# Patient Record
Sex: Male | Born: 1950 | Race: Black or African American | Hispanic: No | Marital: Single | Smoking: Never smoker
Health system: Southern US, Community
[De-identification: ages and names within clinical notes are randomized; demographics above are authoritative.]

## PROBLEM LIST (undated history)

## (undated) DIAGNOSIS — S4292XA Fracture of left shoulder girdle, part unspecified, initial encounter for closed fracture: Secondary | ICD-10-CM

## (undated) DIAGNOSIS — E785 Hyperlipidemia, unspecified: Secondary | ICD-10-CM

## (undated) DIAGNOSIS — K219 Gastro-esophageal reflux disease without esophagitis: Secondary | ICD-10-CM

## (undated) HISTORY — DX: Gastro-esophageal reflux disease without esophagitis: K21.9

## (undated) HISTORY — DX: Hyperlipidemia, unspecified: E78.5

## (undated) HISTORY — PX: OTHER SURGICAL HISTORY: SHX169

---

## 2008-03-07 ENCOUNTER — Ambulatory Visit (HOSPITAL_COMMUNITY): Admission: RE | Admit: 2008-03-07 | Discharge: 2008-03-07 | Payer: Self-pay | Admitting: General Surgery

## 2010-11-26 NOTE — H&P (Signed)
[name], Christopher Gates               ACCOUNT NO.:  [id number]   MEDICAL RECORD NO.:  [id number]          PATIENT TYPE:  AMB   LOCATION:  DAY                           FACILITY:  [facility]   PHYSICIAN:  Dalia Heading, M.D.  DATE OF BIRTH:  [birth year]   DATE OF ADMISSION:  DATE OF DISCHARGE:  LH                              HISTORY & PHYSICAL   CHIEF COMPLAINT:  Need for screening colonoscopy.   HISTORY OF PRESENT ILLNESS:  The patient is a 60 year old black male who  is referred for endoscopic evaluation.  He needs a colonoscopy for  screening purposes.  No abdominal pain, weight loss, nausea, vomiting,  diarrhea, constipation, melena, or hematochezia have been noted.  He has  never had a colonoscopy.  There is no family history of colon carcinoma.   PAST MEDICAL HISTORY:  High cholesterol levels.   PAST SURGICAL HISTORY:  Unknown.   CURRENT MEDICATIONS:  Simvastatin and omeprazole.   ALLERGIES:  No known drug allergies.   REVIEW OF SYSTEMS:  Noncontributory.   PHYSICAL EXAMINATION:  GENERAL:  The patient is a well-developed, well-  nourished black male in no acute distress.  LUNGS:  Clear to auscultation with equal breath sounds bilaterally.  HEART:  Regular rate and rhythm without S3, S4, or murmurs.  ABDOMEN:  Soft, nontender, and nondistended.  No hepatosplenomegaly or  masses are noted.  RECTAL:  Deferred due to the procedure.   IMPRESSION:  Need for screening colonoscopy.   PLAN:  The patient is scheduled for a colonoscopy on March 07, 2008.  The risks and benefits of the procedure including bleeding and  perforation were fully explained to the patient, who gave informed  consent.      Dalia Heading, M.D.  Electronically Signed     MAJ/MEDQ  D:  03/02/2008  T:  03/03/2008  Job:  [id number]   cc:   Kirk Ruths, M.D.  Fax: [phone]

## 2011-07-31 DIAGNOSIS — IMO0002 Reserved for concepts with insufficient information to code with codable children: Secondary | ICD-10-CM | POA: Diagnosis not present

## 2011-07-31 DIAGNOSIS — Z23 Encounter for immunization: Secondary | ICD-10-CM | POA: Diagnosis not present

## 2011-07-31 DIAGNOSIS — K219 Gastro-esophageal reflux disease without esophagitis: Secondary | ICD-10-CM | POA: Diagnosis not present

## 2011-07-31 DIAGNOSIS — E785 Hyperlipidemia, unspecified: Secondary | ICD-10-CM | POA: Diagnosis not present

## 2011-08-04 DIAGNOSIS — H409 Unspecified glaucoma: Secondary | ICD-10-CM | POA: Diagnosis not present

## 2011-08-04 DIAGNOSIS — H4011X0 Primary open-angle glaucoma, stage unspecified: Secondary | ICD-10-CM | POA: Diagnosis not present

## 2011-09-12 ENCOUNTER — Ambulatory Visit (HOSPITAL_COMMUNITY)
Admission: RE | Admit: 2011-09-12 | Discharge: 2011-09-12 | Disposition: A | Discharge status: Home / Self Care | Payer: Medicare Other | Source: Ambulatory Visit | Attending: Internal Medicine | Admitting: Internal Medicine

## 2011-09-12 ENCOUNTER — Other Ambulatory Visit (HOSPITAL_COMMUNITY): Payer: Self-pay | Admitting: Internal Medicine

## 2011-09-12 DIAGNOSIS — M79609 Pain in unspecified limb: Secondary | ICD-10-CM

## 2011-09-12 DIAGNOSIS — IMO0002 Reserved for concepts with insufficient information to code with codable children: Secondary | ICD-10-CM | POA: Diagnosis not present

## 2011-10-17 DIAGNOSIS — H5210 Myopia, unspecified eye: Secondary | ICD-10-CM | POA: Diagnosis not present

## 2011-10-17 DIAGNOSIS — H409 Unspecified glaucoma: Secondary | ICD-10-CM | POA: Diagnosis not present

## 2011-10-17 DIAGNOSIS — H4011X0 Primary open-angle glaucoma, stage unspecified: Secondary | ICD-10-CM | POA: Diagnosis not present

## 2011-10-17 DIAGNOSIS — H52229 Regular astigmatism, unspecified eye: Secondary | ICD-10-CM | POA: Diagnosis not present

## 2012-04-12 DIAGNOSIS — E782 Mixed hyperlipidemia: Secondary | ICD-10-CM | POA: Diagnosis not present

## 2012-04-12 DIAGNOSIS — Z125 Encounter for screening for malignant neoplasm of prostate: Secondary | ICD-10-CM | POA: Diagnosis not present

## 2012-04-22 DIAGNOSIS — Z23 Encounter for immunization: Secondary | ICD-10-CM | POA: Diagnosis not present

## 2012-07-29 DIAGNOSIS — E785 Hyperlipidemia, unspecified: Secondary | ICD-10-CM | POA: Diagnosis not present

## 2012-07-29 DIAGNOSIS — Z6826 Body mass index (BMI) 26.0-26.9, adult: Secondary | ICD-10-CM | POA: Diagnosis not present

## 2012-07-29 DIAGNOSIS — E781 Pure hyperglyceridemia: Secondary | ICD-10-CM | POA: Diagnosis not present

## 2012-07-29 DIAGNOSIS — K219 Gastro-esophageal reflux disease without esophagitis: Secondary | ICD-10-CM | POA: Diagnosis not present

## 2012-07-30 DIAGNOSIS — E781 Pure hyperglyceridemia: Secondary | ICD-10-CM | POA: Diagnosis not present

## 2012-07-30 DIAGNOSIS — E785 Hyperlipidemia, unspecified: Secondary | ICD-10-CM | POA: Diagnosis not present

## 2012-10-18 DIAGNOSIS — H4011X0 Primary open-angle glaucoma, stage unspecified: Secondary | ICD-10-CM | POA: Diagnosis not present

## 2013-04-07 DIAGNOSIS — Z23 Encounter for immunization: Secondary | ICD-10-CM | POA: Diagnosis not present

## 2013-05-02 DIAGNOSIS — H4011X0 Primary open-angle glaucoma, stage unspecified: Secondary | ICD-10-CM | POA: Diagnosis not present

## 2013-06-30 DIAGNOSIS — Z6826 Body mass index (BMI) 26.0-26.9, adult: Secondary | ICD-10-CM | POA: Diagnosis not present

## 2013-06-30 DIAGNOSIS — M542 Cervicalgia: Secondary | ICD-10-CM | POA: Diagnosis not present

## 2013-06-30 DIAGNOSIS — S139XXA Sprain of joints and ligaments of unspecified parts of neck, initial encounter: Secondary | ICD-10-CM | POA: Diagnosis not present

## 2013-07-22 DIAGNOSIS — E785 Hyperlipidemia, unspecified: Secondary | ICD-10-CM | POA: Diagnosis not present

## 2013-07-22 DIAGNOSIS — F70 Mild intellectual disabilities: Secondary | ICD-10-CM | POA: Diagnosis not present

## 2013-07-22 DIAGNOSIS — Z6826 Body mass index (BMI) 26.0-26.9, adult: Secondary | ICD-10-CM | POA: Diagnosis not present

## 2013-07-25 DIAGNOSIS — E785 Hyperlipidemia, unspecified: Secondary | ICD-10-CM | POA: Diagnosis not present

## 2013-07-25 DIAGNOSIS — Z6826 Body mass index (BMI) 26.0-26.9, adult: Secondary | ICD-10-CM | POA: Diagnosis not present

## 2013-10-17 ENCOUNTER — Other Ambulatory Visit (HOSPITAL_COMMUNITY): Payer: Self-pay | Admitting: Family Medicine

## 2013-10-17 ENCOUNTER — Ambulatory Visit (HOSPITAL_COMMUNITY)
Admission: RE | Admit: 2013-10-17 | Discharge: 2013-10-17 | Disposition: A | Discharge status: Home / Self Care | Payer: Medicare Other | Source: Ambulatory Visit | Attending: Family Medicine | Admitting: Family Medicine

## 2013-10-17 DIAGNOSIS — M25519 Pain in unspecified shoulder: Secondary | ICD-10-CM

## 2013-10-17 DIAGNOSIS — S42009A Fracture of unspecified part of unspecified clavicle, initial encounter for closed fracture: Secondary | ICD-10-CM | POA: Diagnosis not present

## 2013-10-17 DIAGNOSIS — S42033A Displaced fracture of lateral end of unspecified clavicle, initial encounter for closed fracture: Secondary | ICD-10-CM | POA: Insufficient documentation

## 2013-10-17 DIAGNOSIS — Z6826 Body mass index (BMI) 26.0-26.9, adult: Secondary | ICD-10-CM | POA: Diagnosis not present

## 2013-10-17 DIAGNOSIS — X58XXXA Exposure to other specified factors, initial encounter: Secondary | ICD-10-CM | POA: Insufficient documentation

## 2013-10-18 DIAGNOSIS — M25519 Pain in unspecified shoulder: Secondary | ICD-10-CM | POA: Diagnosis not present

## 2013-10-24 ENCOUNTER — Encounter (HOSPITAL_COMMUNITY): Payer: Self-pay | Admitting: Emergency Medicine

## 2013-10-24 ENCOUNTER — Ambulatory Visit (HOSPITAL_COMMUNITY)
Admission: RE | Admit: 2013-10-24 | Discharge: 2013-10-24 | Disposition: A | Discharge status: Home / Self Care | Payer: Medicare Other | Source: Ambulatory Visit | Attending: Family Medicine | Admitting: Family Medicine

## 2013-10-24 ENCOUNTER — Emergency Department (HOSPITAL_COMMUNITY)
Admission: EM | Admit: 2013-10-24 | Discharge: 2013-10-24 | Disposition: A | Discharge status: Home / Self Care | Payer: Medicare Other | Attending: Emergency Medicine | Admitting: Emergency Medicine

## 2013-10-24 ENCOUNTER — Other Ambulatory Visit (HOSPITAL_COMMUNITY): Payer: Self-pay | Admitting: Family Medicine

## 2013-10-24 DIAGNOSIS — M7989 Other specified soft tissue disorders: Secondary | ICD-10-CM

## 2013-10-24 DIAGNOSIS — I82409 Acute embolism and thrombosis of unspecified deep veins of unspecified lower extremity: Secondary | ICD-10-CM | POA: Insufficient documentation

## 2013-10-24 DIAGNOSIS — M79609 Pain in unspecified limb: Secondary | ICD-10-CM | POA: Diagnosis not present

## 2013-10-24 DIAGNOSIS — Z7901 Long term (current) use of anticoagulants: Secondary | ICD-10-CM | POA: Insufficient documentation

## 2013-10-24 DIAGNOSIS — Z79899 Other long term (current) drug therapy: Secondary | ICD-10-CM | POA: Insufficient documentation

## 2013-10-24 DIAGNOSIS — Z6826 Body mass index (BMI) 26.0-26.9, adult: Secondary | ICD-10-CM | POA: Diagnosis not present

## 2013-10-24 DIAGNOSIS — I824Y9 Acute embolism and thrombosis of unspecified deep veins of unspecified proximal lower extremity: Secondary | ICD-10-CM | POA: Diagnosis not present

## 2013-10-24 DIAGNOSIS — Z8781 Personal history of (healed) traumatic fracture: Secondary | ICD-10-CM | POA: Diagnosis not present

## 2013-10-24 HISTORY — DX: Fracture of left shoulder girdle, part unspecified, initial encounter for closed fracture: S42.92XA

## 2013-10-24 LAB — PROTIME-INR
INR: 1.06 (ref 0.00–1.49)
Prothrombin Time: 13.6 seconds (ref 11.6–15.2)

## 2013-10-24 LAB — CBC WITH DIFFERENTIAL/PLATELET
BASOS ABS: 0 10*3/uL (ref 0.0–0.1)
BASOS PCT: 0 % (ref 0–1)
EOS ABS: 0.2 10*3/uL (ref 0.0–0.7)
Eosinophils Relative: 3 % (ref 0–5)
HCT: 41.9 % (ref 39.0–52.0)
Hemoglobin: 13.9 g/dL (ref 13.0–17.0)
Lymphocytes Relative: 34 % (ref 12–46)
Lymphs Abs: 2.2 10*3/uL (ref 0.7–4.0)
MCH: 28.8 pg (ref 26.0–34.0)
MCHC: 33.2 g/dL (ref 30.0–36.0)
MCV: 86.7 fL (ref 78.0–100.0)
MONOS PCT: 8 % (ref 3–12)
Monocytes Absolute: 0.5 10*3/uL (ref 0.1–1.0)
NEUTROS PCT: 56 % (ref 43–77)
Neutro Abs: 3.6 10*3/uL (ref 1.7–7.7)
Platelets: 153 10*3/uL (ref 150–400)
RBC: 4.83 MIL/uL (ref 4.22–5.81)
RDW: 14.1 % (ref 11.5–15.5)
WBC: 6.5 10*3/uL (ref 4.0–10.5)

## 2013-10-24 LAB — BASIC METABOLIC PANEL
BUN: 15 mg/dL (ref 6–23)
CO2: 29 mEq/L (ref 19–32)
Calcium: 10 mg/dL (ref 8.4–10.5)
Chloride: 100 mEq/L (ref 96–112)
Creatinine, Ser: 1.21 mg/dL (ref 0.50–1.35)
GFR calc Af Amer: 73 mL/min — ABNORMAL LOW (ref >90)
GFR, EST NON AFRICAN AMERICAN: 63 mL/min — AB (ref >90)
Glucose, Bld: 92 mg/dL (ref 70–99)
POTASSIUM: 4.5 meq/L (ref 3.7–5.3)
Sodium: 139 mEq/L (ref 137–147)

## 2013-10-24 MED ORDER — RIVAROXABAN 15 MG PO TABS
15.0000 mg | ORAL_TABLET | Freq: Once | ORAL | Status: AC
  Administered 2013-10-24: 15 mg via ORAL
  Filled 2013-10-24: qty 1

## 2013-10-24 MED ORDER — XARELTO VTE STARTER PACK 15 & 20 MG PO TBPK: 15.0000 mg | ORAL_TABLET | ORAL | Status: DC

## 2013-10-24 NOTE — ED Notes (Signed)
Sent here from radiology ,  Has a DVT in left leg.  Doctor wanted him seen in er and admitted for blood thinners.

## 2013-10-24 NOTE — Discharge Instructions (Signed)
Information on my medicine - XARELTO (rivaroxaban)  This medication education was reviewed with me or my healthcare representative as part of my discharge preparation.  The pharmacist that spoke with me during my hospital stay was:  Ena Dawley, [place]? Xarelto was prescribed to treat blood clots that may have been found in the veins of your legs (deep vein thrombosis) or in your lungs (pulmonary embolism) and to reduce the risk of them occurring again.  What do you need to know about Xarelto? The starting dose is one 15 mg tablet taken TWICE daily with food for the FIRST 21 DAYS then on (enter date)  May 4th  the dose is changed to one 20 mg tablet taken ONCE A DAY with your evening meal.  DO NOT stop taking Xarelto without talking to the health care provider who prescribed the medication.  Refill your prescription for 20 mg tablets before you run out.  After discharge, you should have regular check-up appointments with your healthcare provider that is prescribing your Xarelto.  In the future your dose may need to be changed if your kidney function changes by a significant amount.  What do you do if you miss a dose? If you are taking Xarelto TWICE DAILY and you miss a dose, take it as soon as you remember. You may take two 15 mg tablets (total 30 mg) at the same time then resume your regularly scheduled 15 mg twice daily the next day.  If you are taking Xarelto ONCE DAILY and you miss a dose, take it as soon as you remember on the same day then continue your regularly scheduled once daily regimen the next day. Do not take two doses of Xarelto at the same time.   Important Safety Information Xarelto is a blood thinner medicine that can cause bleeding. You should call your healthcare provider right away if you experience any of the following:   Bleeding from an injury or your nose that does not stop.   Unusual colored urine (red or dark brown) or  unusual colored stools (red or black).   Unusual bruising for unknown reasons.   A serious fall or if you hit your head (even if there is no bleeding).  Some medicines may interact with Xarelto and might increase your risk of bleeding while on Xarelto. To help avoid this, consult your healthcare provider or pharmacist prior to using any new prescription or non-prescription medications, including herbals, vitamins, non-steroidal anti-inflammatory drugs (NSAIDs) and supplements.  This website has more information on Xarelto: [email or web address]

## 2013-10-24 NOTE — ED Provider Notes (Signed)
CSN: [id number]     Arrival date & time 10/24/13  1108 History   This chart was scribed for Sherie Dobrowolski Tripplet by Lovena Le Day, ED scribe. This patient was seen in room [facility] and the patient's care was started at 1108.  Chief Complaint  Patient presents with  . Leg Pain   Patient is a 63 y.o. male presenting with leg pain.  Leg Pain Associated symptoms: no back pain, no fever and no neck pain    HPI Comments: Christopher Gates is a 63 y.o. male who presents to the Emergency Department complaining of crampy pain to the lower left leg for several days.  Patient resides at a local assisted living facility and the care giver states that she noticed the patient limping recently and took him to his PMD just prior to ED arrival and he was scheduled to have an ultrasound of his leg and was advised that he had a blood clot in his leg and told to come to ED for further evaluation.  Patient reports that he is able to bear weight on the affected leg.  He denies fever, chills, chest pain, shortness of breath, numbness or weakness of the leg.  He also denies hx of previous DVT's or PE's.  Caregiver states that he recently fractured his left upper arm and has not been as active recently as usual.    Past Medical History  Diagnosis Date  . Shoulder fracture, left    History reviewed. No pertinent past surgical history. History reviewed. No pertinent family history. History  Substance Use Topics  . Smoking status: Never Smoker   . Smokeless tobacco: Not on file  . Alcohol Use: No    Review of Systems  Constitutional: Negative for fever and chills.  Respiratory: Negative for cough, chest tightness and shortness of breath.   Cardiovascular: Negative for chest pain.  Gastrointestinal: Negative for nausea, vomiting and abdominal pain.  Genitourinary: Negative for dysuria and difficulty urinating.  Musculoskeletal: Positive for arthralgias and myalgias. Negative for back pain, joint swelling, neck pain and  neck stiffness.  Skin: Negative for color change and wound.  Neurological: Negative for seizures, syncope, weakness and numbness.  All other systems reviewed and are negative.   Allergies  Review of patient's allergies indicates no known allergies.  Home Medications   Current Outpatient Rx  Name  Route  Sig  Dispense  Refill  . cyclobenzaprine (FLEXERIL) 10 MG tablet   Oral   Take 10 mg by mouth 3 (three) times daily as needed for muscle spasms.         . Omega-3 Fatty Acids (FISH OIL) 1000 MG CAPS   Oral   Take 1 capsule by mouth daily.         Marland Kitchen omeprazole (PRILOSEC) 20 MG capsule   Oral   Take 20 mg by mouth daily.         . Polyvinyl Alcohol-Povidone (REFRESH OP)   Both Eyes   Place 1 drop into both eyes as needed (dry eyes).         . simvastatin (ZOCOR) 40 MG tablet   Oral   Take 40 mg by mouth at bedtime.          Triage Vitals: BP 116/77  Pulse 72  Temp(Src) 98 F (36.7 C) (Oral)  Resp 18  Ht 5\' 6"  (1.676 m)  Wt 165 lb (74.844 kg)  BMI 26.64 kg/m2  SpO2 98%  Physical Exam  Nursing note and vitals reviewed. Constitutional:  He is oriented to person, place, and time. He appears well-developed and well-nourished. No distress.  HENT:  Head: Normocephalic and atraumatic.  Cardiovascular: Normal rate, regular rhythm, normal heart sounds and intact distal pulses.  Exam reveals no friction rub.   No murmur heard. Pulmonary/Chest: Effort normal and breath sounds normal. No respiratory distress.  Abdominal: Soft. He exhibits no distension. There is no tenderness. There is no rebound.  Musculoskeletal: Normal range of motion. He exhibits edema and tenderness.  Patient points to lateral left lower leg as area of tenderness.  Mild STS noted.   No obvious erythema, no joint tenderness of the hip or knee.  DP pulse is palpable, distal sensation intact. Calf is soft and slightly tender to palpation.  Pt has full ROM of the LE  Neurological: He is alert and  oriented to person, place, and time. He exhibits normal muscle tone. Coordination normal.  Skin: Skin is warm and dry. No erythema.    ED Course  Procedures (including critical care time) DIAGNOSTIC STUDIES: Oxygen Saturation is 98% on room air, normal by my interpretation.       Labs Review Labs Reviewed  BASIC METABOLIC PANEL - Abnormal; Notable for the following:    GFR calc non Af Amer 63 (*)    GFR calc Af Amer 73 (*)    All other components within normal limits  CBC WITH DIFFERENTIAL  PROTIME-INR   Imaging Review Dg Tibia/fibula Left  10/24/2013   CLINICAL DATA:  Left leg pain and swelling without injury.  EXAM: LEFT TIBIA AND FIBULA - 2 VIEW  COMPARISON:  None.  FINDINGS: There is no evidence of fracture or other focal bone lesions. Soft tissues are unremarkable.  IMPRESSION: Normal left tibia and fibula.   Electronically Signed   By: Sabino Dick M.D.   On: 10/24/2013 11:05   US Venous Img Lower Unilateral Left  10/24/2013   CLINICAL DATA:  Left leg pain.  EXAM: LEFT LOWER EXTREMITY VENOUS DOPPLER ULTRASOUND  TECHNIQUE: Gray-scale sonography with graded compression, as well as color Doppler and duplex ultrasound were performed to evaluate the lower extremity deep venous systems from the level of the common femoral vein and including the common femoral, femoral, profunda femoral, popliteal and calf veins including the posterior tibial, peroneal and gastrocnemius veins when visible. The superficial great saphenous vein was also interrogated. Spectral Doppler was utilized to evaluate flow at rest and with distal augmentation maneuvers in the common femoral, femoral and popliteal veins.  COMPARISON:  None.  FINDINGS: Common Femoral Vein: No evidence of thrombus. Normal compressibility, respiratory phasicity and response to augmentation.  Saphenofemoral Junction: No evidence of thrombus. Normal compressibility and flow on color Doppler imaging.  Profunda Femoral Vein: No evidence of  thrombus. Normal compressibility and flow on color Doppler imaging.  Femoral Vein: No evidence of thrombus. Normal compressibility, respiratory phasicity and response to augmentation.  Popliteal Vein: Nonocclusive thrombus in the left popliteal vein, where there is incomplete compressibility and sluggish flow.  Calf Veins: No evidence of thrombus. Normal compressibility and flow on color Doppler imaging.  Superficial Great Saphenous Vein: No evidence of thrombus. Normal compressibility and flow on color Doppler imaging.  Other Findings:  None.  IMPRESSION: 1. Study is positive for nonocclusive deep venous thrombosis in the left popliteal vein.   Electronically Signed   By: Vinnie Langton M.D.   On: 10/24/2013 10:25     EKG Interpretation None      MDM   Final diagnoses:  DVT of lower extremity (deep venous thrombosis)   Patient care and findings discussed with EDP.  Pt is well appearing, alert, VSS, and has ate meal tray.  He is ambulatory per caregiver. DVT is nonocclusive and it is felt that patient can be managed at home with Xarelto for DVT.    Initial medication dosed in the ED, pharmacy has discussed medication regimen with pt and caregiver and dosing instructions and hand out given.  Caregiver verbalizes understanding of instructions and agrees to return here if needed and close f/u with PMD.    I personally performed the services described in this documentation, which was scribed in my presence. The recorded information has been reviewed and is accurate.      Louise Victory L. Morrell Fluke, PA-C 10/26/13 1404

## 2013-10-24 NOTE — ED Notes (Signed)
Pt received discharge instructions and prescriptions, verbalized understanding and has no further questions. Pt ambulated to exit in stable condition accompanied by friend.  Advised to return to emergency department with new or worsening symptoms.

## 2013-10-27 NOTE — ED Provider Notes (Signed)
Medical screening examination/treatment/procedure(s) were performed by non-physician practitioner and as supervising physician I was immediately available for consultation/collaboration.   EKG Interpretation None        Mervin Kung, MD 10/27/13 [phone]

## 2013-11-09 DIAGNOSIS — Z6826 Body mass index (BMI) 26.0-26.9, adult: Secondary | ICD-10-CM | POA: Diagnosis not present

## 2013-11-09 DIAGNOSIS — I82409 Acute embolism and thrombosis of unspecified deep veins of unspecified lower extremity: Secondary | ICD-10-CM | POA: Diagnosis not present

## 2013-11-14 DIAGNOSIS — M25519 Pain in unspecified shoulder: Secondary | ICD-10-CM | POA: Diagnosis not present

## 2013-11-18 DIAGNOSIS — H4011X0 Primary open-angle glaucoma, stage unspecified: Secondary | ICD-10-CM | POA: Diagnosis not present

## 2013-11-18 DIAGNOSIS — H409 Unspecified glaucoma: Secondary | ICD-10-CM | POA: Diagnosis not present

## 2013-11-23 DIAGNOSIS — M7989 Other specified soft tissue disorders: Secondary | ICD-10-CM | POA: Diagnosis not present

## 2013-11-23 DIAGNOSIS — I82409 Acute embolism and thrombosis of unspecified deep veins of unspecified lower extremity: Secondary | ICD-10-CM | POA: Diagnosis not present

## 2013-11-23 DIAGNOSIS — Z6827 Body mass index (BMI) 27.0-27.9, adult: Secondary | ICD-10-CM | POA: Diagnosis not present

## 2013-12-09 DIAGNOSIS — Z6826 Body mass index (BMI) 26.0-26.9, adult: Secondary | ICD-10-CM | POA: Diagnosis not present

## 2013-12-09 DIAGNOSIS — R609 Edema, unspecified: Secondary | ICD-10-CM | POA: Diagnosis not present

## 2013-12-15 DIAGNOSIS — S42023A Displaced fracture of shaft of unspecified clavicle, initial encounter for closed fracture: Secondary | ICD-10-CM | POA: Diagnosis not present

## 2013-12-20 ENCOUNTER — Ambulatory Visit (HOSPITAL_COMMUNITY)
Admission: RE | Admit: 2013-12-20 | Discharge: 2013-12-20 | Disposition: A | Discharge status: Home / Self Care | Payer: Medicare Other | Source: Ambulatory Visit | Attending: Sports Medicine | Admitting: Sports Medicine

## 2013-12-20 DIAGNOSIS — M6281 Muscle weakness (generalized): Secondary | ICD-10-CM | POA: Insufficient documentation

## 2013-12-20 DIAGNOSIS — IMO0001 Reserved for inherently not codable concepts without codable children: Secondary | ICD-10-CM | POA: Diagnosis not present

## 2013-12-20 DIAGNOSIS — M25619 Stiffness of unspecified shoulder, not elsewhere classified: Secondary | ICD-10-CM | POA: Insufficient documentation

## 2013-12-20 DIAGNOSIS — M25519 Pain in unspecified shoulder: Secondary | ICD-10-CM | POA: Diagnosis not present

## 2013-12-20 DIAGNOSIS — S42009A Fracture of unspecified part of unspecified clavicle, initial encounter for closed fracture: Secondary | ICD-10-CM | POA: Insufficient documentation

## 2013-12-20 NOTE — Evaluation (Signed)
Occupational Therapy Evaluation  Patient Details  Name: Christopher Gates MRN: [id number] Date of Birth: [birth year]  Today's Date: 12/20/2013 Time: 0812-0836 OT Time Calculation (min): 24 min OT eval 812-836 24'  Visit#: 1 of 16  Re-eval: 01/17/14  Assessment Diagnosis: s/p left clavicle fx Prior Therapy: None  Authorization: Medicare  Authorization Time Period: before 10th visit  Authorization Visit#: 1 of 10   Past Medical History:  Past Medical History  Diagnosis Date  . Shoulder fracture, left    Past Surgical History: No past surgical history on file.  Subjective Symptoms/Limitations Symptoms: S: I fell off a chair. Pertinent History: April 2015, Christopher Gates states he fell off of a chair and experienced pain in his left clavicle. Patient sustained a left clavicle fx causing limited range of motion and decreased strength. Dr. Alfonso Gates has referred patient to occupational therapy for evaluation and treatment.  Limitations: Raising arm overhead. Patient Stated Goals: To be able to use arm normally.  Pain Assessment Currently in Pain?: No/denies  Precautions/Restrictions  Precautions Precautions: None Restrictions Weight Bearing Restrictions: No  Balance Screening Balance Screen Has the patient fallen in the past 6 months: Yes How many times?: 1 Has the patient had a decrease in activity level because of a fear of falling? : No Is the patient reluctant to leave their home because of a fear of falling? : No  Prior [place] expects to be discharged to:: Group home Living Arrangements: Non-relatives/Friends (Christopher Gates - caregiver) Available Help at Discharge: Available 24 hours/day Type of Home: Group Home Prior Function Level of Independence: Independent with basic ADLs;Independent with gait Driving: No Vocation: Unemployed  Assessment ADL/Vision/Perception ADL ADL Comments: Patient reports no difficulty completing BADL tasks  such as bathing and dressing. Patient is not required to perform any IADL tasks. Patient states he has difficulty raising arm up overhead and lifting heavy items.  Dominant Hand: Right Vision - History Baseline Vision: No visual deficits  Cognition/Observation Cognition Overall Cognitive Status: Impaired/Different from baseline Arousal/Alertness: Awake/alert Orientation Level: Oriented to person;Oriented to time Memory: Impaired   Additional Assessments RUE AROM (degrees) Right Shoulder Flexion: 103 Degrees Right Shoulder ABduction: 90 Degrees Right Shoulder Internal Rotation: 90 Degrees Right Shoulder External Rotation: 74 Degrees RUE Strength Right Shoulder Flexion:  (4-/5) Right Shoulder ABduction:  (3+/5) Right Shoulder Internal Rotation:  (4-/5) Right Shoulder External Rotation:  (4-/5) LUE Assessment LUE Assessment: Within Functional Limits Palpation Palpation: Mod fascial restrictions in left upper arm, trapezius, and scapularis region.       Occupational Therapy Assessment and Plan OT Assessment and Plan Clinical Impression Statement: A: Patient is a 63 y/o male s/p healed left clavical fx presenting to OT with decreased AROM and strength causing difficulty completing BADL tasks. Pt will benefit from skilled therapeutic intervention in order to improve on the following deficits: Increased fascial restricitons;Impaired UE functional use;Decreased strength;Decreased range of motion Rehab Potential: Excellent OT Frequency: Min 2X/week OT Duration: 8 weeks OT Treatment/Interventions: Self-care/ADL training;Therapeutic exercise;Manual therapy;Patient/family education;Therapeutic activities;Modalities OT Plan: P: Pt will benefit from skilled OT services for increasing LUE ROM, decreasing fascial restrictions, improving strength, and improving overall LUE functional use.     Treatment Plan: AAROM/AROM, myofascial release and manual stretching, proximal shoulder strengthening,  scapular stabilization, general  ROM strengthening     Goals Short Term Goals Time to Complete Short Term Goals: 4 weeks Short Term Goal 1: Patient will be educated on HEP.  Short Term Goal 2: Patient will increase Left  shoulder strength to 4+/5 to increase ability to lift heavy items. Short Term Goal 3: Patient will decrease fascial restrictions to min amount.  Short Term Goal 4: Patient will increase AROM to WNL to increase ability to complete daily tasks.  Long Term Goals Time to Complete Long Term Goals: 8 weeks Long Term Goal 1: Patient will return to highest level of independence with all BADL and leisure tasks.  Long Term Goal 2: Patient will increase Left shoulder strength to 5/5 to increase ability to lift heavy items. Long Term Goal 3: Patient will decrease fascial restrictions to trace amount in left shoulder/clavicle region.  Long Term Goal 4: Patient will increase left shoulder stability to be able to work overhead for 2 minutes without fatigue.   Problem List Patient Active Problem List   Diagnosis Date Noted  . Pain in joint, shoulder region 12/20/2013  . Muscle weakness (generalized) 12/20/2013  . Fx clavicle 12/20/2013    End of Session Activity Tolerance: Patient tolerated treatment well General Behavior During Therapy: [facility] for tasks assessed/performed OT Plan of Care OT Home Exercise Plan: shoulder stretches OT Patient Instructions: handout (scanned) Consulted and Agree with Plan of Care: Patient;Family member/caregiver Family Member Consulted: caregiver  - Christopher Gates  GO Functional Assessment Tool Used: FOTO score: 53/100 (47% impaired) Functional Limitation: Carrying, moving and handling objects Carrying, Moving and Handling Objects Current Status ([id number]): At least 40 percent but less than 60 percent impaired, limited or restricted Carrying, Moving and Handling Objects Goal Status [phone]): At least 1 percent but less than 20 percent impaired, limited or  restricted  Ailene Ravel, OTR/L,CBIS   12/20/2013, 9:03 AM  Physician Documentation Your signature is required to indicate approval of the treatment plan as stated above.  Please sign and either send electronically or make a copy of this report for your files and return this physician signed original.  Please mark one 1.__approve of plan  2. ___approve of plan with the following conditions.   ______________________________                                                          _____________________ Physician Signature                                                                                                             Date

## 2013-12-22 ENCOUNTER — Ambulatory Visit (HOSPITAL_COMMUNITY)
Admission: RE | Admit: 2013-12-22 | Discharge: 2013-12-22 | Disposition: A | Discharge status: Home / Self Care | Payer: Medicare Other | Source: Ambulatory Visit | Attending: Family Medicine | Admitting: Family Medicine

## 2013-12-22 DIAGNOSIS — IMO0001 Reserved for inherently not codable concepts without codable children: Secondary | ICD-10-CM | POA: Diagnosis not present

## 2013-12-22 NOTE — Progress Notes (Signed)
Occupational Therapy Treatment Patient Details  Name: Christopher Gates MRN: [id number] Date of Birth: [birth year]  Today's Date: 12/22/2013 Time: 0017-4944 OT Time Calculation (min): 41 min MFR 807-814 8' Therex 967-591 33'  Visit#: 2 of 16  Re-eval: 01/17/14    Authorization: Medicare  Authorization Time Period: before 10th visit  Authorization Visit#: 2 of 10  Subjective Symptoms/Limitations Symptoms: S: It doesn't hurt.  Pain Assessment Currently in Pain?: No/denies  Precautions/Restrictions  Precautions Precautions: None  Exercise/Treatments Supine Protraction: PROM;10 reps;AROM;12 reps Horizontal ABduction: PROM;10 reps;AROM;12 reps External Rotation: PROM;10 reps;AROM;12 reps Internal Rotation: PROM;10 reps;AROM;12 reps Flexion: PROM;10 reps;AROM;12 reps ABduction: PROM;10 reps;AROM;12 reps Standing Protraction: AROM;12 reps Horizontal ABduction: AROM;12 reps External Rotation: AROM;12 reps Internal Rotation: AROM;12 reps Flexion: AROM;12 reps ABduction: AROM;12 reps Extension: Theraband;12 reps Theraband Level (Shoulder Extension): Level 2 (Red) Row: Theraband;12 reps Theraband Level (Shoulder Row): Level 2 (Red) Retraction: Theraband;12 reps Theraband Level (Shoulder Retraction): Level 2 (Red) ROM / Strengthening / Isometric Strengthening Wall Wash: 1' Proximal Shoulder Strengthening, Supine: 12X Proximal Shoulder Strengthening, Seated: 12X      Manual Therapy Manual Therapy: Myofascial release Myofascial Release: Myofascial release (MFR) and manual stretching to left upper arm, trapezius, and scapularis region to decrease fascial restrictions and increase joint mobility in a pain free zone.   Occupational Therapy Assessment and Plan OT Assessment and Plan Clinical Impression Statement: A: patient completed all exercises with good form and technique with guidance from therapist. No pain noted this date. Patient was given AROM exercises this date to  add to HEP. OT Plan: P: Add X to V arm, W arms   Goals Short Term Goals Time to Complete Short Term Goals: 4 weeks Short Term Goal 1: Patient will be educated on HEP.  Short Term Goal 1 Progress: Progressing toward goal Short Term Goal 2: Patient will increase Left shoulder strength to 4+/5 to increase ability to lift heavy items. Short Term Goal 2 Progress: Progressing toward goal Short Term Goal 3: Patient will decrease fascial restrictions to min amount.  Short Term Goal 3 Progress: Progressing toward goal Short Term Goal 4: Patient will increase AROM to WNL to increase ability to complete daily tasks.  Short Term Goal 4 Progress: Progressing toward goal Long Term Goals Time to Complete Long Term Goals: 8 weeks Long Term Goal 1: Patient will return to highest level of independence with all BADL and leisure tasks.  Long Term Goal 1 Progress: Progressing toward goal Long Term Goal 2: Patient will increase Left shoulder strength to 5/5 to increase ability to lift heavy items. Long Term Goal 2 Progress: Progressing toward goal Long Term Goal 3: Patient will decrease fascial restrictions to trace amount in left shoulder/clavicle region.  Long Term Goal 3 Progress: Progressing toward goal Long Term Goal 4: Patient will increase left shoulder stability to be able to work overhead for 2 minutes without fatigue.  Long Term Goal 4 Progress: Progressing toward goal  Problem List Patient Active Problem List   Diagnosis Date Noted  . Pain in joint, shoulder region 12/20/2013  . Muscle weakness (generalized) 12/20/2013  . Fx clavicle 12/20/2013    End of Session Activity Tolerance: Patient tolerated treatment well General Behavior During Therapy: [facility] for tasks assessed/performed OT Plan of Care OT Home Exercise Plan: AROM exercises OT Patient Instructions: handout (scanned) Consulted and Agree with Plan of Care: Patient;Family member/caregiver Family Member Consulted: caregiver  - Ms.  Elyse Jarvis Edouard Gikas, OTR/L,CBIS   12/22/2013, 9:14 AM

## 2013-12-27 ENCOUNTER — Ambulatory Visit (HOSPITAL_COMMUNITY)
Admission: RE | Admit: 2013-12-27 | Discharge: 2013-12-27 | Disposition: A | Discharge status: Home / Self Care | Payer: Medicare Other | Source: Ambulatory Visit | Attending: Sports Medicine | Admitting: Sports Medicine

## 2013-12-27 DIAGNOSIS — IMO0001 Reserved for inherently not codable concepts without codable children: Secondary | ICD-10-CM | POA: Diagnosis not present

## 2013-12-27 NOTE — Progress Notes (Signed)
Occupational Therapy Treatment Patient Details  Name: Rodrick Payson Menden MRN: [id number] Date of Birth: Nov 22, 1950  Today's Date: 12/27/2013 Time: 5176-1607 OT Time Calculation (min): 45 min MFR 930-945 15' Therex [id number] 30'  Visit#: 3 of 16  Re-eval: 01/17/14    Authorization: Medicare  Authorization Time Period: before 10th visit  Authorization Visit#: 3 of 10  Subjective Symptoms/Limitations Symptoms: S: I've been doing the exercises you gave me on the sheet. Pain Assessment Currently in Pain?: No/denies  Precautions/Restrictions  Precautions Precautions: None  Exercise/Treatments Supine Protraction: PROM;10 reps;AROM;12 reps Horizontal ABduction: PROM;10 reps;AROM;12 reps External Rotation: PROM;10 reps;AROM;12 reps Internal Rotation: PROM;10 reps;AROM;12 reps Flexion: PROM;10 reps;AROM;12 reps ABduction: PROM;10 reps;AROM;12 reps Standing Protraction: AROM;12 reps Horizontal ABduction: AROM;12 reps External Rotation: AROM;12 reps Internal Rotation: AROM;12 reps Flexion: AROM;12 reps ABduction: AROM;12 reps Extension: Theraband;12 reps Theraband Level (Shoulder Extension): Level 2 (Red) Row: Theraband;12 reps Theraband Level (Shoulder Row): Level 2 (Red) Retraction: Theraband;12 reps Theraband Level (Shoulder Retraction): Level 2 (Red) ROM / Strengthening / Isometric Strengthening UBE (Upper Arm Bike): Level 1 5' forward Wall Wash: 2' "W" Arms: 12X X to V Arms: 12X Proximal Shoulder Strengthening, Supine: 12X Proximal Shoulder Strengthening, Seated: 12X      Manual Therapy Manual Therapy: Myofascial release Myofascial Release: Myofascial release (MFR) and manual stretching to left upper arm, trapezius, and scapularis region to decrease fascial restrictions and increase joint mobility in a pain free zone.   Occupational Therapy Assessment and Plan OT Assessment and Plan Clinical Impression Statement: A: Increased time with wall wash, added X to V arms,  W arms, and UBE bike. Patient tolerated well.  OT Plan: P: Add 1# weight to supine and standing exercises.    Goals Short Term Goals Time to Complete Short Term Goals: 4 weeks Short Term Goal 1: Patient will be educated on HEP.  Short Term Goal 1 Progress: Progressing toward goal Short Term Goal 2: Patient will increase Left shoulder strength to 4+/5 to increase ability to lift heavy items. Short Term Goal 2 Progress: Progressing toward goal Short Term Goal 3: Patient will decrease fascial restrictions to min amount.  Short Term Goal 3 Progress: Progressing toward goal Short Term Goal 4: Patient will increase AROM to WNL to increase ability to complete daily tasks.  Short Term Goal 4 Progress: Progressing toward goal Long Term Goals Time to Complete Long Term Goals: 8 weeks Long Term Goal 1: Patient will return to highest level of independence with all BADL and leisure tasks.  Long Term Goal 1 Progress: Progressing toward goal Long Term Goal 2: Patient will increase Left shoulder strength to 5/5 to increase ability to lift heavy items. Long Term Goal 2 Progress: Progressing toward goal Long Term Goal 3: Patient will decrease fascial restrictions to trace amount in left shoulder/clavicle region.  Long Term Goal 3 Progress: Progressing toward goal Long Term Goal 4: Patient will increase left shoulder stability to be able to work overhead for 2 minutes without fatigue.  Long Term Goal 4 Progress: Progressing toward goal  Problem List Patient Active Problem List   Diagnosis Date Noted  . Pain in joint, shoulder region 12/20/2013  . Muscle weakness (generalized) 12/20/2013  . Fx clavicle 12/20/2013    End of Session Activity Tolerance: Patient tolerated treatment well General Behavior During Therapy: [facility] for tasks assessed/performed      Ailene Ravel, OTR/L,CBIS   12/27/2013, 10:09 AM

## 2013-12-29 ENCOUNTER — Ambulatory Visit (HOSPITAL_COMMUNITY)
Admission: RE | Admit: 2013-12-29 | Discharge: 2013-12-29 | Disposition: A | Discharge status: Home / Self Care | Payer: Medicare Other | Source: Ambulatory Visit | Attending: Sports Medicine | Admitting: Sports Medicine

## 2013-12-29 DIAGNOSIS — IMO0001 Reserved for inherently not codable concepts without codable children: Secondary | ICD-10-CM | POA: Diagnosis not present

## 2013-12-29 NOTE — Progress Notes (Signed)
Occupational Therapy Treatment Patient Details  Name: Christopher Gates MRN: [id number] Date of Birth: [birth year]  Today's Date: 12/29/2013 Time: 1829-9371 OT Time Calculation (min): 45 min MFR 845-900 15' Therex 900-930 30'  Visit#: 4 of 16  Re-eval: 01/17/14    Authorization: Medicare  Authorization Time Period: before 10th visit  Authorization Visit#: 4 of 10  Subjective Symptoms/Limitations Symptoms: S: It feels real good. Pain Assessment Currently in Pain?: No/denies  Precautions/Restrictions  Precautions Precautions: None  Exercise/Treatments Supine Protraction: PROM;5 reps;Strengthening;Weights;12 reps Protraction Weight (lbs): 1 Horizontal ABduction: PROM;5 reps;Strengthening;Weights;12 reps Horizontal ABduction Weight (lbs): 1 External Rotation: PROM;5 reps;Strengthening;Weights;12 reps External Rotation Weight (lbs): 1 Internal Rotation: PROM;5 reps;Strengthening;Weights;12 reps Internal Rotation Weight (lbs): 1 Flexion: PROM;5 reps;Strengthening;Weights;12 reps Shoulder Flexion Weight (lbs): 1 ABduction: PROM;5 reps;Strengthening;Weights;12 reps Shoulder ABduction Weight (lbs): 1 Standing Protraction: Strengthening;Weights;12 reps Protraction Weight (lbs): 1 Horizontal ABduction: Strengthening;Weights;12 reps Horizontal ABduction Weight (lbs): 1 External Rotation: Strengthening;Weights;12 reps External Rotation Weight (lbs): 1 Internal Rotation: Strengthening;Weights;12 reps Internal Rotation Weight (lbs): 1 Flexion: Strengthening;Weights;12 reps Shoulder Flexion Weight (lbs): 1 ABduction: Strengthening;Weights;12 reps Shoulder ABduction Weight (lbs): 1 ROM / Strengthening / Isometric Strengthening UBE (Upper Arm Bike): Level 1 6' forward Wall Wash: 2' "W" Arms: 12X with 1# X to V Arms: 12X with 1# Proximal Shoulder Strengthening, Supine: 12X with 1# Proximal Shoulder Strengthening, Seated: 12X with 1# Ball on Wall: 1' flexion 1' abduction green      Manual Therapy Manual Therapy: Myofascial release Myofascial Release: Myofascial release (MFR) and manual stretching to left upper arm, trapezius, and scapularis region to decrease fascial restrictions and increase joint mobility in a pain free zone.  Occupational Therapy Assessment and Plan OT Assessment and Plan Clinical Impression Statement: A: Added 1# handweight to supine and standing exercises, added ball on the wall. Patient tolerated all exercises well. No complaints.  OT Plan: P: Cont to work on strengthening Left shoulder.    Goals Short Term Goals Time to Complete Short Term Goals: 4 weeks Short Term Goal 1: Patient will be educated on HEP.  Short Term Goal 2: Patient will increase Left shoulder strength to 4+/5 to increase ability to lift heavy items. Short Term Goal 3: Patient will decrease fascial restrictions to min amount.  Short Term Goal 4: Patient will increase AROM to WNL to increase ability to complete daily tasks.  Long Term Goals Time to Complete Long Term Goals: 8 weeks Long Term Goal 1: Patient will return to highest level of independence with all BADL and leisure tasks.  Long Term Goal 2: Patient will increase Left shoulder strength to 5/5 to increase ability to lift heavy items. Long Term Goal 3: Patient will decrease fascial restrictions to trace amount in left shoulder/clavicle region.  Long Term Goal 4: Patient will increase left shoulder stability to be able to work overhead for 2 minutes without fatigue.   Problem List Patient Active Problem List   Diagnosis Date Noted  . Pain in joint, shoulder region 12/20/2013  . Muscle weakness (generalized) 12/20/2013  . Fx clavicle 12/20/2013    End of Session Activity Tolerance: Patient tolerated treatment well General Behavior During Therapy: [facility] for tasks assessed/performed   Ailene Ravel, OTR/L,CBIS   12/29/2013, 9:25 AM

## 2014-01-03 ENCOUNTER — Ambulatory Visit (HOSPITAL_COMMUNITY): Payer: Medicare Other

## 2014-01-05 ENCOUNTER — Ambulatory Visit (HOSPITAL_COMMUNITY)
Admission: RE | Admit: 2014-01-05 | Discharge: 2014-01-05 | Disposition: A | Discharge status: Home / Self Care | Payer: Medicare Other | Source: Ambulatory Visit | Attending: Sports Medicine | Admitting: Sports Medicine

## 2014-01-05 DIAGNOSIS — IMO0001 Reserved for inherently not codable concepts without codable children: Secondary | ICD-10-CM | POA: Diagnosis not present

## 2014-01-05 NOTE — Progress Notes (Signed)
Occupational Therapy Treatment Patient Details  Name: Divante Kotch Hirth MRN: [id number] Date of Birth: [birth year]  Today's Date: 01/05/2014 Time: 2409-7353 OT Time Calculation (min): 45 min MFR 845-900 15' Therex 900-930 30'  Visit#: 5 of 16  Re-eval: 01/17/14    Authorization: Medicare  Authorization Time Period: before 10th visit  Authorization Visit#: 5 of 10  Subjective Symptoms/Limitations Symptoms: S: I've been washing th walls at home. Pain Assessment Currently in Pain?: No/denies  Precautions/Restrictions  Precautions Precautions: None  Exercise/Treatments Supine Protraction: PROM;5 reps;Strengthening;Weights;12 reps Protraction Weight (lbs): 1 Horizontal ABduction: PROM;5 reps;Strengthening;Weights;12 reps Horizontal ABduction Weight (lbs): 1 External Rotation: PROM;5 reps;Strengthening;Weights;12 reps External Rotation Weight (lbs): 1 Internal Rotation: PROM;5 reps;Strengthening;Weights;12 reps Internal Rotation Weight (lbs): 1 Flexion: PROM;5 reps;Strengthening;Weights;12 reps Shoulder Flexion Weight (lbs): 1 ABduction: PROM;5 reps;Strengthening;Weights;12 reps Shoulder ABduction Weight (lbs): 1 Standing Horizontal ABduction: Theraband;12 reps Theraband Level (Shoulder Horizontal ABduction): Level 2 (Red) External Rotation: Theraband;12 reps Theraband Level (Shoulder External Rotation): Level 2 (Red) Internal Rotation: Theraband;12 reps Theraband Level (Shoulder Internal Rotation): Level 2 (Red) Flexion: Theraband;12 reps Theraband Level (Shoulder Flexion): Level 2 (Red) ABduction: Theraband;12 reps Theraband Level (Shoulder ABduction): Level 2 (Red) Other Standing Exercises: Reverse Diagonal Flys 12X; theraband ROM / Strengthening / Isometric Strengthening UBE (Upper Arm Bike): Level 2 6' forward Proximal Shoulder Strengthening, Supine: 12X with 1#       Manual Therapy Manual Therapy: Myofascial release Myofascial Release: Myofascial release  (MFR) and manual stretching to left upper arm, trapezius, and scapularis region to decrease fascial restrictions and increase joint mobility in a pain free zone.  Occupational Therapy Assessment and Plan OT Assessment and Plan Clinical Impression Statement: A: Patient given theraband HEP and educated on exercises. patient is to complete theraband and not do previous exercises given.  OT Plan: P: Add Cybex press and row.    Goals Short Term Goals Time to Complete Short Term Goals: 4 weeks Short Term Goal 1: Patient will be educated on HEP.  Short Term Goal 1 Progress: Progressing toward goal Short Term Goal 2: Patient will increase Left shoulder strength to 4+/5 to increase ability to lift heavy items. Short Term Goal 2 Progress: Progressing toward goal Short Term Goal 3: Patient will decrease fascial restrictions to min amount.  Short Term Goal 3 Progress: Progressing toward goal Short Term Goal 4: Patient will increase AROM to WNL to increase ability to complete daily tasks.  Short Term Goal 4 Progress: Progressing toward goal Long Term Goals Time to Complete Long Term Goals: 8 weeks Long Term Goal 1: Patient will return to highest level of independence with all BADL and leisure tasks.  Long Term Goal 1 Progress: Progressing toward goal Long Term Goal 2: Patient will increase Left shoulder strength to 5/5 to increase ability to lift heavy items. Long Term Goal 2 Progress: Progressing toward goal Long Term Goal 3: Patient will decrease fascial restrictions to trace amount in left shoulder/clavicle region.  Long Term Goal 3 Progress: Progressing toward goal Long Term Goal 4: Patient will increase left shoulder stability to be able to work overhead for 2 minutes without fatigue.  Long Term Goal 4 Progress: Progressing toward goal  Problem List Patient Active Problem List   Diagnosis Date Noted  . Pain in joint, shoulder region 12/20/2013  . Muscle weakness (generalized) 12/20/2013  .  Fx clavicle 12/20/2013    End of Session Activity Tolerance: Patient tolerated treatment well General Behavior During Therapy: [facility] for tasks assessed/performed OT Plan of Care OT Home Exercise  Plan: Theraband exercises OT Patient Instructions: handout (scanned) Consulted and Agree with Plan of Care: Patient   Ailene Ravel, OTR/L,CBIS   01/05/2014, 9:26 AM

## 2014-01-09 DIAGNOSIS — I82409 Acute embolism and thrombosis of unspecified deep veins of unspecified lower extremity: Secondary | ICD-10-CM | POA: Diagnosis not present

## 2014-01-09 DIAGNOSIS — Z6826 Body mass index (BMI) 26.0-26.9, adult: Secondary | ICD-10-CM | POA: Diagnosis not present

## 2014-01-09 DIAGNOSIS — L819 Disorder of pigmentation, unspecified: Secondary | ICD-10-CM | POA: Diagnosis not present

## 2014-01-10 ENCOUNTER — Ambulatory Visit (HOSPITAL_COMMUNITY)
Admission: RE | Admit: 2014-01-10 | Discharge: 2014-01-10 | Disposition: A | Discharge status: Home / Self Care | Payer: Medicare Other | Source: Ambulatory Visit | Attending: Sports Medicine | Admitting: Sports Medicine

## 2014-01-10 DIAGNOSIS — IMO0001 Reserved for inherently not codable concepts without codable children: Secondary | ICD-10-CM | POA: Diagnosis not present

## 2014-01-10 NOTE — Progress Notes (Signed)
Occupational Therapy Treatment Patient Details  Name: Christopher Gates MRN: [id number] Date of Birth: 1950-11-04  Today's Date: 01/10/2014 Time: 1115-5208 OT Time Calculation (min): 45 min Therex 022-336 45'   Visit#: 6 of 16  Re-eval: 01/17/14    Authorization: Medicare  Authorization Time Period: before 10th visit  Authorization Visit#: 6 of 10  Subjective Symptoms/Limitations Symptoms: S: No pain.  Pain Assessment Currently in Pain?: No/denies  Precautions/Restrictions  Precautions Precautions: None  Exercise/Treatments Supine Protraction: Strengthening;15 reps Protraction Weight (lbs): 2 Horizontal ABduction: Strengthening;15 reps Horizontal ABduction Weight (lbs): 2 External Rotation: Strengthening;15 reps External Rotation Weight (lbs): 2 Internal Rotation: Strengthening;15 reps Internal Rotation Weight (lbs): 2 Flexion: Strengthening;15 reps Shoulder Flexion Weight (lbs): 2 ABduction: Strengthening;15 reps Shoulder ABduction Weight (lbs): 2 Standing Protraction: Strengthening;Weights;12 reps Protraction Weight (lbs): 2 Horizontal ABduction: Strengthening;12 reps Horizontal ABduction Weight (lbs): 2 External Rotation: Strengthening;12 reps External Rotation Weight (lbs): 2 Internal Rotation: Strengthening;12 reps Internal Rotation Weight (lbs): 2 Flexion: Strengthening;12 reps Shoulder Flexion Weight (lbs): 2 ABduction: Strengthening;12 reps Shoulder ABduction Weight (lbs): 2 ROM / Strengthening / Isometric Strengthening UBE (Upper Arm Bike): Level 2 6' forward Wall Wash: 2' "W" Arms: 10X with 2# X to V Arms: 12X with 2# Proximal Shoulder Strengthening, Supine: 15X with 2# Proximal Shoulder Strengthening, Seated: 12X with 2# Ball on Wall: 1' flexion 1' abduction green         Occupational Therapy Assessment and Plan OT Assessment and Plan Clinical Impression Statement: A: Increased to 2# handweights this session. Patient tolerated well.  OT  Plan: P: Reassess for D/C.   Goals Short Term Goals Time to Complete Short Term Goals: 4 weeks Short Term Goal 1: Patient will be educated on HEP.  Short Term Goal 1 Progress: Progressing toward goal Short Term Goal 2: Patient will increase Left shoulder strength to 4+/5 to increase ability to lift heavy items. Short Term Goal 2 Progress: Progressing toward goal Short Term Goal 3: Patient will decrease fascial restrictions to min amount.  Short Term Goal 3 Progress: Met Short Term Goal 4: Patient will increase AROM to WNL to increase ability to complete daily tasks.  Short Term Goal 4 Progress: Progressing toward goal Long Term Goals Time to Complete Long Term Goals: 8 weeks Long Term Goal 1: Patient will return to highest level of independence with all BADL and leisure tasks.  Long Term Goal 1 Progress: Progressing toward goal Long Term Goal 2: Patient will increase Left shoulder strength to 5/5 to increase ability to lift heavy items. Long Term Goal 2 Progress: Progressing toward goal Long Term Goal 3: Patient will decrease fascial restrictions to trace amount in left shoulder/clavicle region.  Long Term Goal 3 Progress: Met Long Term Goal 4: Patient will increase left shoulder stability to be able to work overhead for 2 minutes without fatigue.  Long Term Goal 4 Progress: Progressing toward goal  Problem List Patient Active Problem List   Diagnosis Date Noted  . Pain in joint, shoulder region 12/20/2013  . Muscle weakness (generalized) 12/20/2013  . Fx clavicle 12/20/2013    End of Session Activity Tolerance: Patient tolerated treatment well General Behavior During Therapy: [facility] for tasks assessed/performed   Ailene Ravel, OTR/L,CBIS   01/10/2014, 9:21 AM

## 2014-01-12 ENCOUNTER — Ambulatory Visit (HOSPITAL_COMMUNITY)
Admission: RE | Admit: 2014-01-12 | Discharge: 2014-01-12 | Disposition: A | Discharge status: Home / Self Care | Payer: Medicare Other | Source: Ambulatory Visit | Attending: Sports Medicine | Admitting: Sports Medicine

## 2014-01-12 DIAGNOSIS — M6281 Muscle weakness (generalized): Secondary | ICD-10-CM | POA: Diagnosis not present

## 2014-01-12 DIAGNOSIS — M25519 Pain in unspecified shoulder: Secondary | ICD-10-CM | POA: Diagnosis not present

## 2014-01-12 DIAGNOSIS — IMO0001 Reserved for inherently not codable concepts without codable children: Secondary | ICD-10-CM | POA: Diagnosis not present

## 2014-01-12 DIAGNOSIS — M25619 Stiffness of unspecified shoulder, not elsewhere classified: Secondary | ICD-10-CM | POA: Insufficient documentation

## 2014-01-12 NOTE — Evaluation (Signed)
Occupational Therapy Reassessment, Treatment, and Discharge  Patient Details  Name: Fallou Hulbert Lacross MRN: [id number] Date of Birth: 09-08-1950  Today's Date: 01/12/2014 Time: 1941-7408 OT Time Calculation (min): 42 min Reassess 144-818 45' Therex 905-930 25'  Visit#: 7 of 16  Re-eval:    Assessment Diagnosis: s/p left clavicle fx  Authorization: Medicare  Authorization Time Period: before 10th visit  Authorization Visit#: 7 of 10   Past Medical History:  Past Medical History  Diagnosis Date  . Shoulder fracture, left    Past Surgical History: No past surgical history on file.  Subjective Symptoms/Limitations Symptoms: S: I do the exercises. I wash the windows and I mop the floor.  Pain Assessment Currently in Pain?: No/denies  Precautions/Restrictions  Precautions Precautions: None  Assessment Additional Assessments RUE Assessment RUE Assessment:  (assessed seated. IR/ER Adducted) RUE AROM (degrees) Right Shoulder Flexion: 150 Degrees (on eval: 103) Right Shoulder ABduction: 146 Degrees (on eval: 90) Right Shoulder Internal Rotation: 90 Degrees (on eval: same) Right Shoulder External Rotation: 80 Degrees (on eval: 74) RUE Strength Right Shoulder Flexion: 5/5 (on eval: 4-/5) Right Shoulder ABduction: 5/5 (on eval: 3+/5) Right Shoulder Internal Rotation: 5/5 (on eval: 4-/5) Right Shoulder External Rotation: 5/5 (on eval; 4-/5) Palpation Palpation: zero fascial restrictions in left upper arm, trapezius, and scapularis region.      Exercise/Treatments Standing Protraction: Strengthening;Weights;12 reps Protraction Weight (lbs): 2 Horizontal ABduction: Strengthening;12 reps Horizontal ABduction Weight (lbs): 2 External Rotation: Strengthening;12 reps External Rotation Weight (lbs): 2 Internal Rotation: Strengthening;12 reps Internal Rotation Weight (lbs): 2 Flexion: Strengthening;12 reps Shoulder Flexion Weight (lbs): 2 ABduction: Strengthening;12  reps Shoulder ABduction Weight (lbs): 2 ROM / Strengthening / Isometric Strengthening UBE (Upper Arm Bike): Level 3 6' forward Cybex Press: 2 plate;15 reps Cybex Row: 2 plate;15 reps Over Head Lace: 2' X to V Arms: 12X with 2# Proximal Shoulder Strengthening, Seated: 12X with 2#         Occupational Therapy Assessment and Plan OT Assessment and Plan Clinical Impression Statement: A: Reassessment completed this date. Patient has met all therapy goals. Patient has improved AROM and shoulder strength and stability. Patient is to continue completing HEP at home independently. Patient is agreeable to discharge from therapy. OT Plan: P:D/C from therapy.    Goals Short Term Goals Time to Complete Short Term Goals: 4 weeks Short Term Goal 1: Patient will be educated on HEP.  Short Term Goal 1 Progress: Met Short Term Goal 2: Patient will increase Left shoulder strength to 4+/5 to increase ability to lift heavy items. Short Term Goal 2 Progress: Met Short Term Goal 3: Patient will decrease fascial restrictions to min amount.  Short Term Goal 3 Progress: Met Short Term Goal 4: Patient will increase AROM to WNL to increase ability to complete daily tasks.  Short Term Goal 4 Progress: Met Long Term Goals Time to Complete Long Term Goals: 8 weeks Long Term Goal 1: Patient will return to highest level of independence with all BADL and leisure tasks.  Long Term Goal 1 Progress: Met Long Term Goal 2: Patient will increase Left shoulder strength to 5/5 to increase ability to lift heavy items. Long Term Goal 2 Progress: Met Long Term Goal 3: Patient will decrease fascial restrictions to trace amount in left shoulder/clavicle region.  Long Term Goal 3 Progress: Met Long Term Goal 4: Patient will increase left shoulder stability to be able to work overhead for 2 minutes without fatigue.  Long Term Goal 4 Progress: Met  Problem  List Patient Active Problem List   Diagnosis Date Noted  . Pain in  joint, shoulder region 12/20/2013  . Muscle weakness (generalized) 12/20/2013  . Fx clavicle 12/20/2013    End of Session Activity Tolerance: Patient tolerated treatment well General Behavior During Therapy: [facility] for tasks assessed/performed  GO Functional Assessment Tool Used: FOTO score: 84/100 (16% impaired) Functional Limitation: Carrying, moving and handling objects Carrying, Moving and Handling Objects Current Status (S8110): At least 1 percent but less than 20 percent impaired, limited or restricted Carrying, Moving and Handling Objects Goal Status [phone]): At least 1 percent but less than 20 percent impaired, limited or restricted Carrying, Moving and Handling Objects Discharge Status [phone]): At least 1 percent but less than 20 percent impaired, limited or restricted  Ailene Ravel, OTR/L,CBIS   01/12/2014, 9:23 AM  Physician Documentation Your signature is required to indicate approval of the treatment plan as stated above.  Please sign and either send electronically or make a copy of this report for your files and return this physician signed original.  Please mark one 1.__approve of plan  2. ___approve of plan with the following conditions.   ______________________________                                                          _____________________ Physician Signature                                                                                                             Date

## 2014-02-09 NOTE — Addendum Note (Signed)
Encounter addended by: Debby Bud, OT on: 02/09/2014  9:41 AM<BR>     Documentation filed: Episodes

## 2014-04-14 DIAGNOSIS — J209 Acute bronchitis, unspecified: Secondary | ICD-10-CM | POA: Diagnosis not present

## 2014-04-14 DIAGNOSIS — Z6827 Body mass index (BMI) 27.0-27.9, adult: Secondary | ICD-10-CM | POA: Diagnosis not present

## 2014-04-14 DIAGNOSIS — E663 Overweight: Secondary | ICD-10-CM | POA: Diagnosis not present

## 2014-04-14 DIAGNOSIS — Z23 Encounter for immunization: Secondary | ICD-10-CM | POA: Diagnosis not present

## 2014-06-20 DIAGNOSIS — E663 Overweight: Secondary | ICD-10-CM | POA: Diagnosis not present

## 2014-06-20 DIAGNOSIS — Z6826 Body mass index (BMI) 26.0-26.9, adult: Secondary | ICD-10-CM | POA: Diagnosis not present

## 2014-06-20 DIAGNOSIS — E782 Mixed hyperlipidemia: Secondary | ICD-10-CM | POA: Diagnosis not present

## 2014-06-20 DIAGNOSIS — K219 Gastro-esophageal reflux disease without esophagitis: Secondary | ICD-10-CM | POA: Diagnosis not present

## 2014-07-21 DIAGNOSIS — H4011X2 Primary open-angle glaucoma, moderate stage: Secondary | ICD-10-CM | POA: Diagnosis not present

## 2014-07-21 DIAGNOSIS — H4011X1 Primary open-angle glaucoma, mild stage: Secondary | ICD-10-CM | POA: Diagnosis not present

## 2014-08-02 DIAGNOSIS — J301 Allergic rhinitis due to pollen: Secondary | ICD-10-CM | POA: Diagnosis not present

## 2014-08-02 DIAGNOSIS — E663 Overweight: Secondary | ICD-10-CM | POA: Diagnosis not present

## 2014-08-02 DIAGNOSIS — Z6826 Body mass index (BMI) 26.0-26.9, adult: Secondary | ICD-10-CM | POA: Diagnosis not present

## 2014-08-02 DIAGNOSIS — J0140 Acute pansinusitis, unspecified: Secondary | ICD-10-CM | POA: Diagnosis not present

## 2014-10-25 DIAGNOSIS — I959 Hypotension, unspecified: Secondary | ICD-10-CM | POA: Diagnosis not present

## 2014-10-25 DIAGNOSIS — Z6826 Body mass index (BMI) 26.0-26.9, adult: Secondary | ICD-10-CM | POA: Diagnosis not present

## 2014-10-25 DIAGNOSIS — R040 Epistaxis: Secondary | ICD-10-CM | POA: Diagnosis not present

## 2014-10-25 DIAGNOSIS — E663 Overweight: Secondary | ICD-10-CM | POA: Diagnosis not present

## 2014-11-15 DIAGNOSIS — Z13220 Encounter for screening for lipoid disorders: Secondary | ICD-10-CM | POA: Diagnosis not present

## 2014-11-15 DIAGNOSIS — E780 Pure hypercholesterolemia: Secondary | ICD-10-CM | POA: Diagnosis not present

## 2014-11-15 DIAGNOSIS — Z0000 Encounter for general adult medical examination without abnormal findings: Secondary | ICD-10-CM | POA: Diagnosis not present

## 2014-11-15 DIAGNOSIS — E782 Mixed hyperlipidemia: Secondary | ICD-10-CM | POA: Diagnosis not present

## 2014-11-15 DIAGNOSIS — Z1389 Encounter for screening for other disorder: Secondary | ICD-10-CM | POA: Diagnosis not present

## 2014-11-15 DIAGNOSIS — Z6824 Body mass index (BMI) 24.0-24.9, adult: Secondary | ICD-10-CM | POA: Diagnosis not present

## 2015-02-12 DIAGNOSIS — H40023 Open angle with borderline findings, high risk, bilateral: Secondary | ICD-10-CM | POA: Diagnosis not present

## 2015-04-18 DIAGNOSIS — Z23 Encounter for immunization: Secondary | ICD-10-CM | POA: Diagnosis not present

## 2015-06-19 DIAGNOSIS — E782 Mixed hyperlipidemia: Secondary | ICD-10-CM | POA: Diagnosis not present

## 2015-06-19 DIAGNOSIS — Z1389 Encounter for screening for other disorder: Secondary | ICD-10-CM | POA: Diagnosis not present

## 2015-06-19 DIAGNOSIS — K219 Gastro-esophageal reflux disease without esophagitis: Secondary | ICD-10-CM | POA: Diagnosis not present

## 2015-06-19 DIAGNOSIS — Z6824 Body mass index (BMI) 24.0-24.9, adult: Secondary | ICD-10-CM | POA: Diagnosis not present

## 2015-11-26 DIAGNOSIS — Z6824 Body mass index (BMI) 24.0-24.9, adult: Secondary | ICD-10-CM | POA: Diagnosis not present

## 2015-11-26 DIAGNOSIS — Z1389 Encounter for screening for other disorder: Secondary | ICD-10-CM | POA: Diagnosis not present

## 2015-11-26 DIAGNOSIS — Z0000 Encounter for general adult medical examination without abnormal findings: Secondary | ICD-10-CM | POA: Diagnosis not present

## 2016-04-12 IMAGING — US US EXTREM LOW VENOUS*L*
1 series · 13 of 24 positions shown · non-contrast
Comparison: None.

CLINICAL DATA: Left leg pain.



[Series 1: us extrem low venous*left* · 0.07mm/px · 13 of 39 slices shown]
[im 1/39]
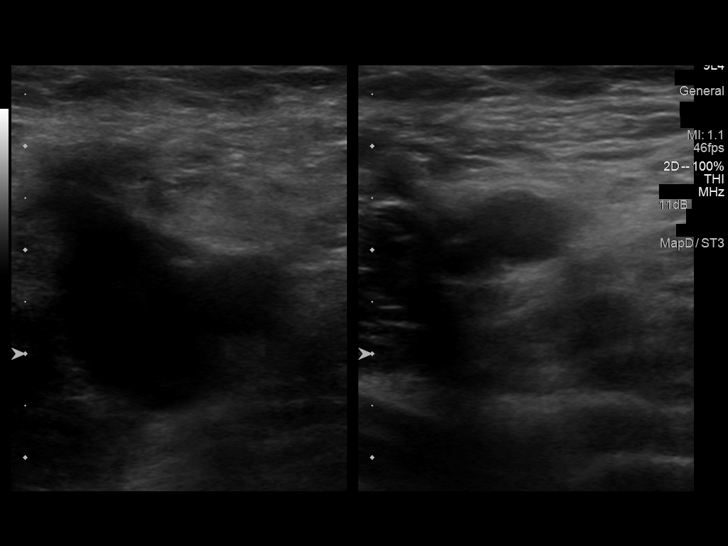
[im 4/39]
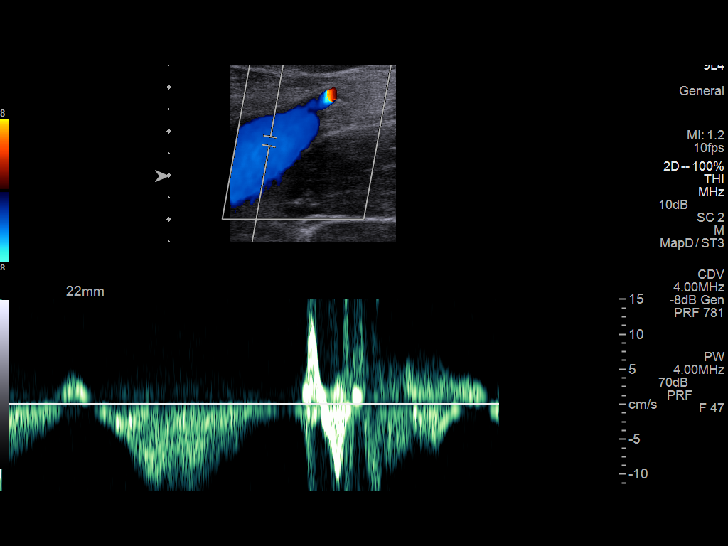
[im 7/39]
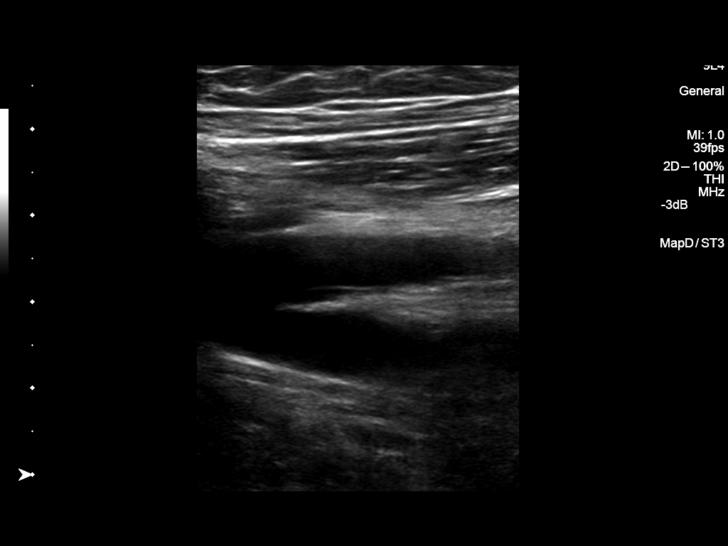
[im 10/39]
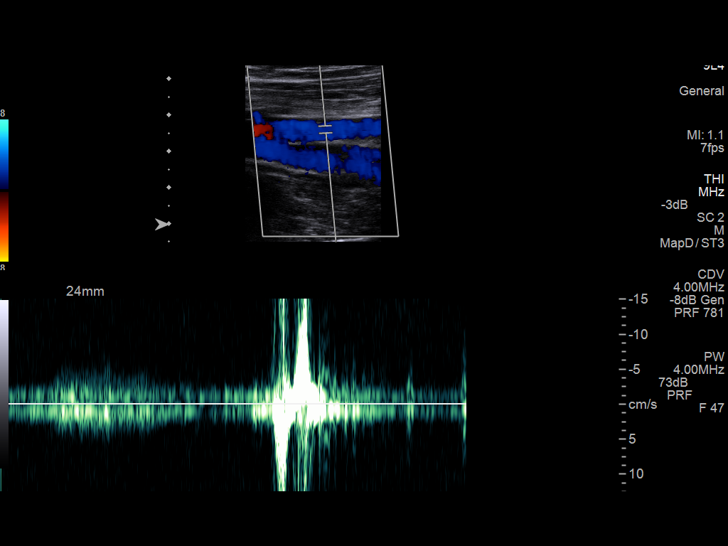
[im 14/39]
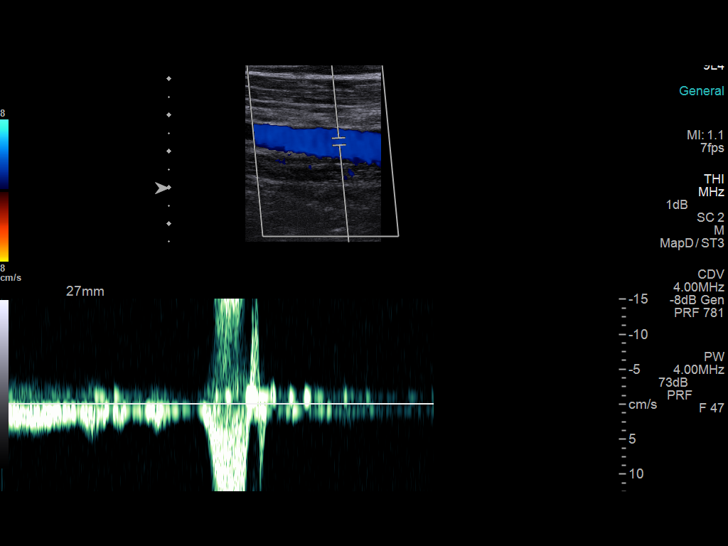
[im 17/39]
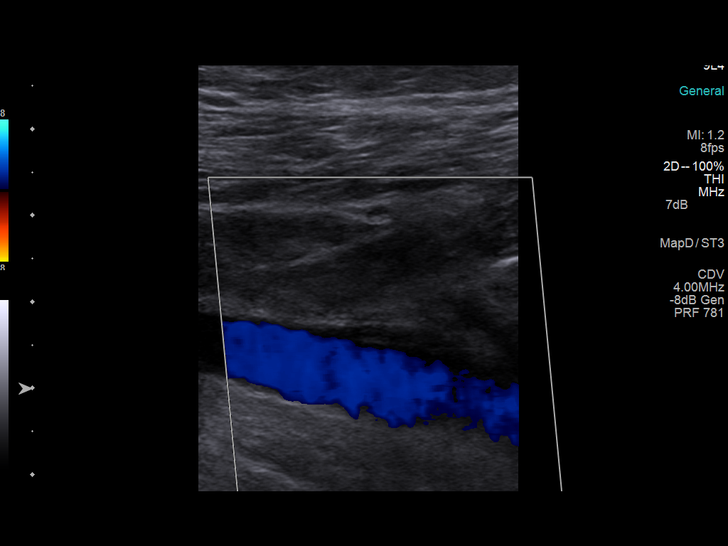
[im 20/39]
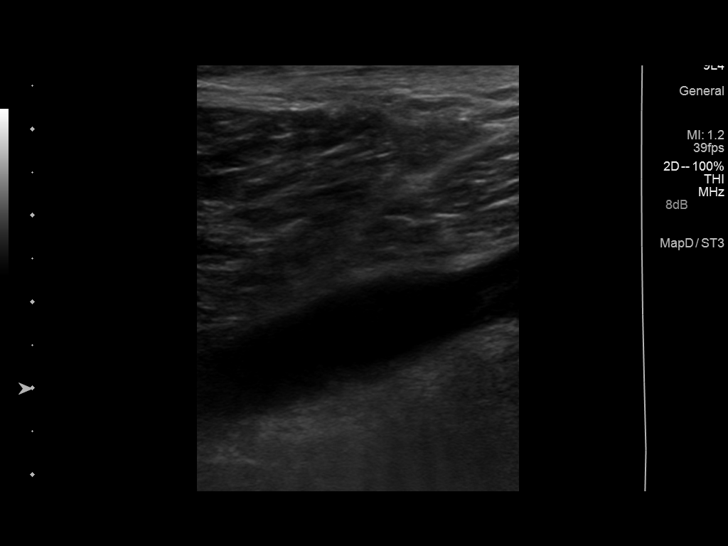
[im 22/39]
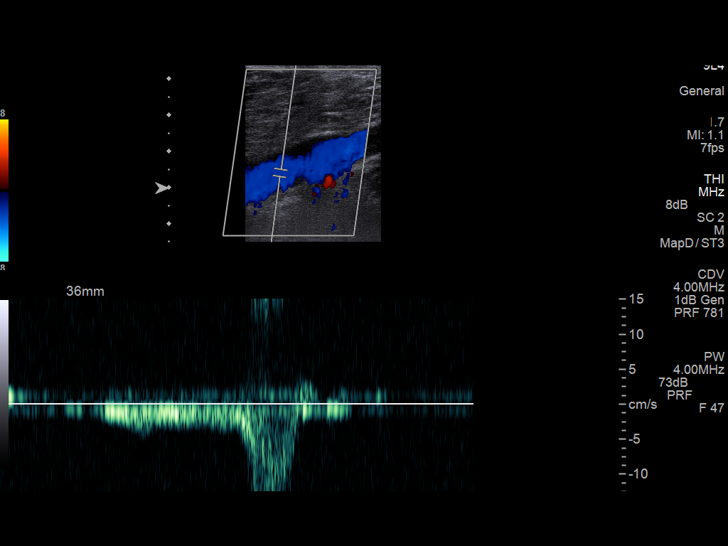
[im 25/39]
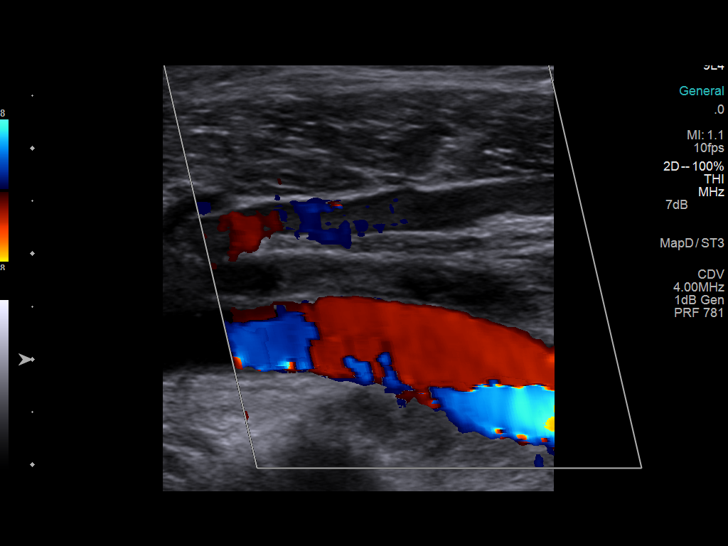
[im 29/39]
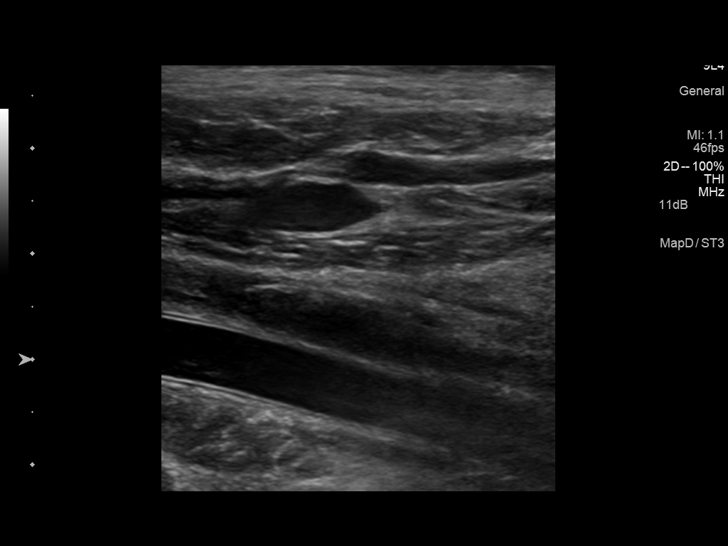
[im 32/39]
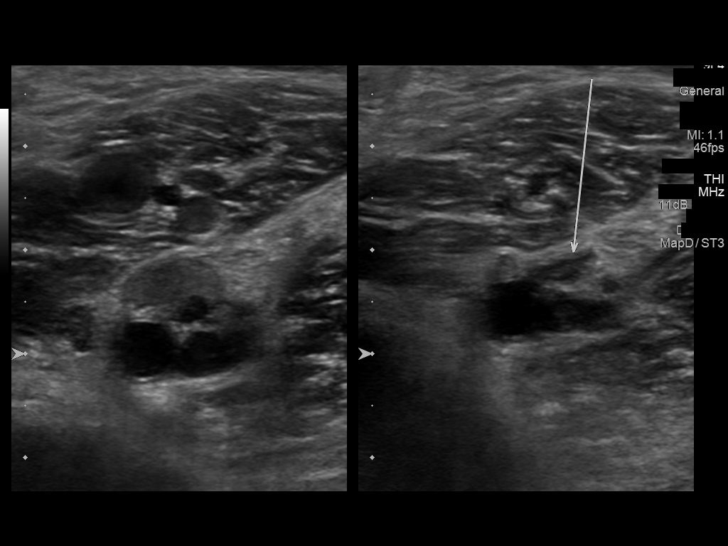
[im 35/39]
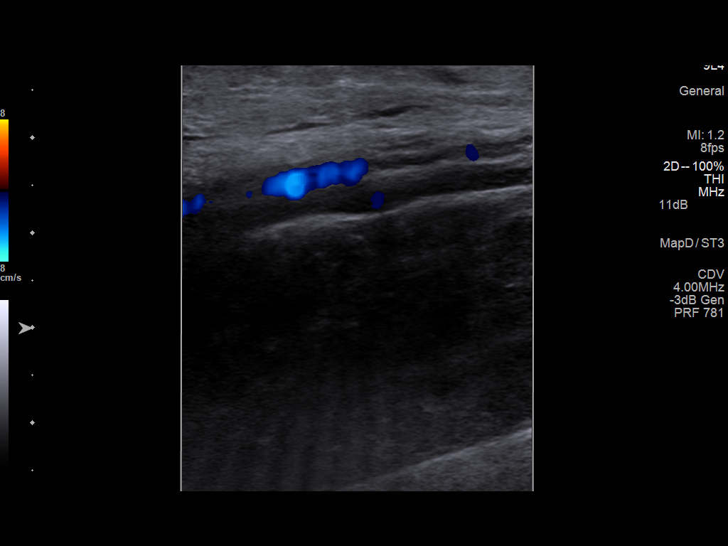
[im 39/39]
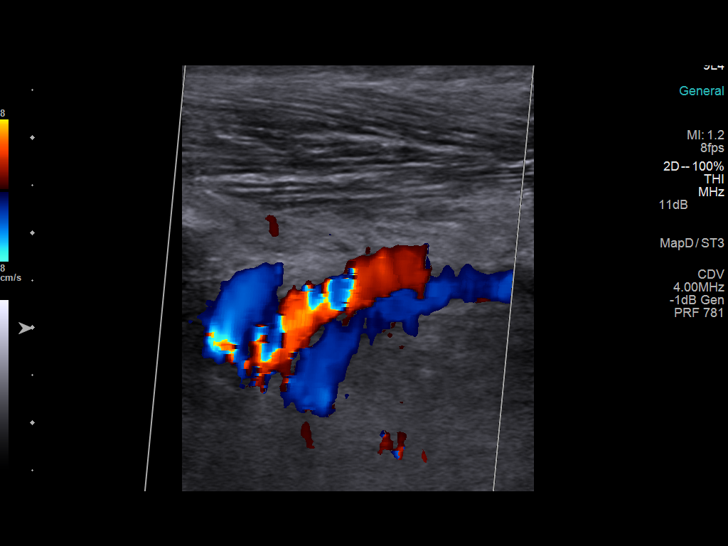

[13 of 24 positions shown; findings below may reference images not displayed]

FINDINGS: Common Femoral Vein: No evidence of thrombus. Normal
compressibility, respiratory phasicity and response to augmentation.

Saphenofemoral Junction: No evidence of thrombus. Normal
compressibility and flow on color Doppler imaging.

Profunda Femoral Vein: No evidence of thrombus. Normal
compressibility and flow on color Doppler imaging.

Femoral Vein: No evidence of thrombus. Normal compressibility,
respiratory phasicity and response to augmentation.

Popliteal Vein: Nonocclusive thrombus in the left popliteal vein,
where there is incomplete compressibility and sluggish flow.

Calf Veins: No evidence of thrombus. Normal compressibility and flow
on color Doppler imaging.

Superficial Great Saphenous Vein: No evidence of thrombus. Normal
compressibility and flow on color Doppler imaging.

Other Findings:  None.
IMPRESSION: 1. Study is positive for nonocclusive deep venous thrombosis in the
left popliteal vein.

## 2016-04-17 DIAGNOSIS — Z23 Encounter for immunization: Secondary | ICD-10-CM | POA: Diagnosis not present

## 2016-06-16 DIAGNOSIS — E782 Mixed hyperlipidemia: Secondary | ICD-10-CM | POA: Diagnosis not present

## 2016-06-16 DIAGNOSIS — Z6824 Body mass index (BMI) 24.0-24.9, adult: Secondary | ICD-10-CM | POA: Diagnosis not present

## 2016-06-16 DIAGNOSIS — F70 Mild intellectual disabilities: Secondary | ICD-10-CM | POA: Diagnosis not present

## 2016-06-16 DIAGNOSIS — K219 Gastro-esophageal reflux disease without esophagitis: Secondary | ICD-10-CM | POA: Diagnosis not present

## 2016-06-16 DIAGNOSIS — E669 Obesity, unspecified: Secondary | ICD-10-CM | POA: Diagnosis not present

## 2016-11-26 DIAGNOSIS — Z6823 Body mass index (BMI) 23.0-23.9, adult: Secondary | ICD-10-CM | POA: Diagnosis not present

## 2016-11-26 DIAGNOSIS — Z1389 Encounter for screening for other disorder: Secondary | ICD-10-CM | POA: Diagnosis not present

## 2016-11-26 DIAGNOSIS — E669 Obesity, unspecified: Secondary | ICD-10-CM | POA: Diagnosis not present

## 2016-11-26 DIAGNOSIS — K219 Gastro-esophageal reflux disease without esophagitis: Secondary | ICD-10-CM | POA: Diagnosis not present

## 2016-11-26 DIAGNOSIS — E782 Mixed hyperlipidemia: Secondary | ICD-10-CM | POA: Diagnosis not present

## 2016-12-02 DIAGNOSIS — Z1211 Encounter for screening for malignant neoplasm of colon: Secondary | ICD-10-CM | POA: Diagnosis not present

## 2016-12-10 ENCOUNTER — Telehealth: Payer: Self-pay

## 2016-12-10 NOTE — Telephone Encounter (Signed)
[phone] PATIENT RECEIVED LETTER TO SCHEDULE TCS

## 2016-12-17 NOTE — Telephone Encounter (Signed)
Called. Many rings and no answer.  

## 2016-12-24 NOTE — Telephone Encounter (Signed)
PT resides at a Home care facility, run by Miachel Roux. It is called [facility]. PT had last colonoscopy by Dr. Arnoldo Morale approx 9-10 years ago. He has lost 5 or 10 lbs. She will be faxing a list of medications to me while I get the last procedure note from [place].

## 2016-12-25 NOTE — Telephone Encounter (Signed)
Med list received. Waiting on previous reports.

## 2016-12-30 NOTE — Telephone Encounter (Signed)
Called. Many rings and no answer.  

## 2016-12-30 NOTE — Telephone Encounter (Signed)
I don't think plus or -1 year for average risk screening colonoscopy makes much difference one way or any other.

## 2016-12-30 NOTE — Telephone Encounter (Signed)
Received the previous colonoscopy report done 03/07/2008 by Aviva Signs, MD.  It said normal colon and next colonoscopy in 10 years. ( Due 02/2018).

## 2016-12-30 NOTE — Telephone Encounter (Signed)
Spoke with Mrs. Blackwell. She is aware pt 's last colonoscopy was 02/2008 and next due in 02/2018. She said he lost a little weight, weighs around 160-169 most of the time. He has not had a lot of loss. There is not enough loss for concern at this time, and she will let us know if he has problems.

## 2016-12-30 NOTE — Telephone Encounter (Signed)
Mrs. Christopher Gates is aware of the information. She said since he is not having any problems at this time, she prefers to check out and see if his insurance would pay before the 10 years. She will let us know if he decides to do now or wait.

## 2016-12-30 NOTE — Telephone Encounter (Signed)
Doris, did the colonoscopy report say anything about the bowel prep, ie good, adequate?

## 2016-12-30 NOTE — Telephone Encounter (Signed)
Typically it would be okay to go ahead and schedule a colonoscopy if it falls within one year from the 10 year follow up mark. If screening only, it really would be whether or not insurance would allow before the 10 year mark.   I would ask Dr. Gala Romney his opinion about proceeding with another colonoscopy at this point in light that PCP is requesting and last one done by outside provider.   DR. Gala Romney WHAT DO YOU ADVISE?

## 2016-12-30 NOTE — Telephone Encounter (Signed)
Magda Paganini, it said quality of the prep was adequate.

## 2017-01-29 DIAGNOSIS — H401132 Primary open-angle glaucoma, bilateral, moderate stage: Secondary | ICD-10-CM | POA: Diagnosis not present

## 2017-02-23 ENCOUNTER — Telehealth: Payer: Self-pay

## 2017-02-23 NOTE — Telephone Encounter (Signed)
Please schedule OV for weight loss.

## 2017-02-23 NOTE — Telephone Encounter (Signed)
Mariane Masters the patient's caregiver call to set up colonoscopy. They had received a letter from [facility] several months ago. Pt is taking ASA 325mg  and no history of heart attacks. She said he wasn't having any GI problems but was losing weight. I told her that I needed to find out from the triage nurse if he could be triaged without an office visit or if she felt that he needed to come in due to weight loss. [phone]

## 2017-02-24 NOTE — Telephone Encounter (Signed)
Tried to call x 2 and line is busy.  Referral letter was sent 11/28/2016.

## 2017-02-24 NOTE — Telephone Encounter (Signed)
I called the caregiver and she is aware of OV for 10/2 at 10. She is wanting the patient seen sooner than this and I explained to her this was our first available and I could put him on a cancellation list. She said that his doctor was wanting him seen pretty quickly. I told her that the doctor could call and speak to the nurse if he needed to, but this was all I had available and pt is on a call back list.

## 2017-02-25 NOTE — Telephone Encounter (Addendum)
I called and spoke to Miachel Roux, the caregiver. ( she has 3 residents in her home). She said that pt weighed 179 lb in July. She has not weighed him since, but she can tell his clothes are getting more loose. She will weigh him again and also get me a list of his medications. She is aware we have him on cancellation list.  I will fax Larene Pickett and see if I can get more info since he was referred in May.

## 2017-02-26 NOTE — Addendum Note (Signed)
Addended by: Everardo All on: 02/26/2017 12:06 PM   Modules accepted: Orders

## 2017-02-26 NOTE — Telephone Encounter (Signed)
Mrs. Christopher Gates brought a list of previous weights by the office as well as medication list.  She had misquoted the weight for July.  The following are the weights for this year: 2018  January       163.0 February     160.5    March          160.5 April             160.0 May              157.0 June             157.5 July               160.0 August          155.5

## 2017-03-10 NOTE — Telephone Encounter (Signed)
Pt has been scheduled an OV with Neil Crouch, PA on 04/14/2017 at 10:00 Am.

## 2017-03-31 DIAGNOSIS — Z6823 Body mass index (BMI) 23.0-23.9, adult: Secondary | ICD-10-CM | POA: Diagnosis not present

## 2017-03-31 DIAGNOSIS — Z0000 Encounter for general adult medical examination without abnormal findings: Secondary | ICD-10-CM | POA: Diagnosis not present

## 2017-03-31 DIAGNOSIS — R710 Precipitous drop in hematocrit: Secondary | ICD-10-CM | POA: Diagnosis not present

## 2017-03-31 DIAGNOSIS — Z1389 Encounter for screening for other disorder: Secondary | ICD-10-CM | POA: Diagnosis not present

## 2017-03-31 DIAGNOSIS — D649 Anemia, unspecified: Secondary | ICD-10-CM | POA: Diagnosis not present

## 2017-04-14 ENCOUNTER — Other Ambulatory Visit: Payer: Self-pay

## 2017-04-14 ENCOUNTER — Ambulatory Visit (INDEPENDENT_AMBULATORY_CARE_PROVIDER_SITE_OTHER): Payer: Medicare Other | Admitting: Gastroenterology

## 2017-04-14 ENCOUNTER — Encounter: Payer: Self-pay | Admitting: Gastroenterology

## 2017-04-14 DIAGNOSIS — R634 Abnormal weight loss: Secondary | ICD-10-CM

## 2017-04-14 MED ORDER — NA SULFATE-K SULFATE-MG SULF 17.5-3.13-1.6 GM/177ML PO SOLN: 1.0000 | ORAL | 0 refills | Status: DC

## 2017-04-14 NOTE — Patient Instructions (Signed)
1. Colonoscopy as scheduled. See separate instructions.  

## 2017-04-14 NOTE — Progress Notes (Addendum)
Primary Care Physician:  Sharilyn Sites, MD  Primary Gastroenterologist:  Barney Drain, MD REVIEWED-NO ADDITIONAL RECOMMENDATIONS.  Chief Complaint  Patient presents with  . Colonoscopy    consult  . Weight Loss    HPI:  Christopher Gates is a 66 y.o. male here to schedule colonoscopy. Last one was over 9 years ago. There has been concern about weight loss, 8 pounds since 07/2016. See below.   He eats well, no abd pain. Appetite good. No dysphagia. Heartburn well controlled on PPI, which he has been on chronically. No n/v, constipation, melena, brbpr.    January       163.0 February     160.5    March          160.5 April             160.0 May              157.0 June             157.5 July               160.0 August          155.5 Today  155  Current Outpatient Prescriptions  Medication Sig Dispense Refill  . aspirin 325 MG tablet Take 325 mg by mouth daily.    . Omega-3 Fatty Acids (FISH OIL) 1000 MG CAPS Take 1 capsule by mouth daily.    Marland Kitchen omeprazole (PRILOSEC) 20 MG capsule Take 20 mg by mouth daily.    . Polyvinyl Alcohol-Povidone (REFRESH OP) Place 1 drop into both eyes as needed (dry eyes).    . simvastatin (ZOCOR) 40 MG tablet Take 40 mg by mouth at bedtime.     No current facility-administered medications for this visit.     Allergies as of 04/14/2017  . (No Known Allergies)    Past Medical History:  Diagnosis Date  . GERD (gastroesophageal reflux disease)   . Hyperlipidemia   . Shoulder fracture, left     Past Surgical History:  Procedure Laterality Date  . facial cysts?      No family history on file. unknown  Social History   Social History  . Marital status: Single    Spouse name: N/A  . Number of children: N/A  . Years of education: N/A   Occupational History  . Not on file.   Social History Main Topics  . Smoking status: Never Smoker  . Smokeless tobacco: Never Used  . Alcohol use No  . Drug use: No  . Sexual activity: Not on file    Other Topics Concern  . Not on file   Social History Narrative  . No narrative on file      ROS:  General: Negative for anorexia,  fever, chills, fatigue, weakness.see hpi Eyes: Negative for vision changes.  ENT: Negative for hoarseness, difficulty swallowing , nasal congestion. CV: Negative for chest pain, angina, palpitations, dyspnea on exertion, peripheral edema.  Respiratory: Negative for dyspnea at rest, dyspnea on exertion, cough, sputum, wheezing.  GI: See history of present illness. GU:  Negative for dysuria, hematuria, urinary incontinence, urinary frequency, nocturnal urination.  MS: Negative for joint pain, low back pain.  Derm: Negative for rash or itching.  Neuro: Negative for weakness, abnormal sensation, seizure, frequent headaches, memory loss, confusion.  Psych: Negative for anxiety, depression, suicidal ideation, hallucinations.  Endo: see hpi Heme: Negative for bruising or bleeding. Allergy: Negative for rash or hives.    Physical Examination:  BP Marland Kitchen)  91/58   Pulse (!) 58   Temp (!) 97.2 F (36.2 C) (Oral)   Ht 5\' 9"  (1.753 m)   Wt 155 lb (70.3 kg)   BMI 22.89 kg/m    General: Well-nourished, well-developed in no acute distress.  Head: Normocephalic, atraumatic.   Eyes: Conjunctiva pink, no icterus. Mouth: Oropharyngeal mucosa moist and pink , no lesions erythema or exudate. Neck: Supple without thyromegaly, masses, or lymphadenopathy.  Lungs: Clear to auscultation bilaterally.  Heart: Regular rate and rhythm, no murmurs rubs or gallops.  Abdomen: Bowel sounds are normal, nontender, nondistended, no hepatosplenomegaly or masses, no abdominal bruits or    hernia , no rebound or guarding.   Rectal: not performed Extremities: No lower extremity edema. No clubbing or deformities.  Neuro: Alert and oriented x 4 , grossly normal neurologically.  Skin: Warm and dry, no rash or jaundice.   Psych: Alert and cooperative, normal mood and affect.    Imaging Studies: No results found.

## 2017-04-14 NOTE — Assessment & Plan Note (Addendum)
66 y/o male with h/o colonoscopy greater than 9 years ago. Sent for screening colonoscopy. He has had some weight loss, 8 pounds this year, steady decline. No GI symptoms. Proceed with colonoscopy in near future.  I have discussed the risks, alternatives, benefits with regards to but not limited to the risk of reaction to medication, bleeding, infection, perforation and the patient is agreeable to proceed. Written consent to be obtained.

## 2017-04-15 NOTE — Progress Notes (Signed)
cc'ed to pcp °

## 2017-04-22 DIAGNOSIS — Z23 Encounter for immunization: Secondary | ICD-10-CM | POA: Diagnosis not present

## 2017-04-27 ENCOUNTER — Telehealth: Payer: Self-pay | Admitting: General Practice

## 2017-04-27 NOTE — Telephone Encounter (Signed)
I tried to call the patient to let him know we needed to reschedule his tcs from 11/9 to 11/19 at 1:00 pm, no answer, lmom.

## 2017-04-28 ENCOUNTER — Encounter: Payer: Self-pay | Admitting: *Deleted

## 2017-04-28 NOTE — Telephone Encounter (Signed)
Called patient and received Caretaker Trilby Drummer (in emergency contacts). She reports she handles all of his appointments. I made her aware of new appointment date/time 06/01/17 at 1 pm with arrival time at 12pm. She verbalized understanding. I also have mailed new instructions to patient.

## 2017-04-28 NOTE — Telephone Encounter (Signed)
Routing to Mindy.  

## 2017-06-01 ENCOUNTER — Encounter (HOSPITAL_COMMUNITY)
Admission: RE | Disposition: A | Discharge status: Home / Self Care | Payer: Self-pay | Source: Ambulatory Visit | Attending: Gastroenterology

## 2017-06-01 ENCOUNTER — Telehealth: Payer: Self-pay | Admitting: Gastroenterology

## 2017-06-01 ENCOUNTER — Other Ambulatory Visit: Payer: Self-pay

## 2017-06-01 ENCOUNTER — Encounter (HOSPITAL_COMMUNITY): Payer: Self-pay | Admitting: *Deleted

## 2017-06-01 ENCOUNTER — Ambulatory Visit (HOSPITAL_COMMUNITY)
Admission: RE | Admit: 2017-06-01 | Discharge: 2017-06-01 | Disposition: A | Discharge status: Home / Self Care | Payer: Medicare Other | Source: Ambulatory Visit | Attending: Gastroenterology | Admitting: Gastroenterology

## 2017-06-01 DIAGNOSIS — K2950 Unspecified chronic gastritis without bleeding: Secondary | ICD-10-CM | POA: Insufficient documentation

## 2017-06-01 DIAGNOSIS — K648 Other hemorrhoids: Secondary | ICD-10-CM | POA: Insufficient documentation

## 2017-06-01 DIAGNOSIS — K219 Gastro-esophageal reflux disease without esophagitis: Secondary | ICD-10-CM | POA: Insufficient documentation

## 2017-06-01 DIAGNOSIS — K644 Residual hemorrhoidal skin tags: Secondary | ICD-10-CM | POA: Insufficient documentation

## 2017-06-01 DIAGNOSIS — Z7982 Long term (current) use of aspirin: Secondary | ICD-10-CM | POA: Diagnosis not present

## 2017-06-01 DIAGNOSIS — K222 Esophageal obstruction: Secondary | ICD-10-CM | POA: Insufficient documentation

## 2017-06-01 DIAGNOSIS — K921 Melena: Secondary | ICD-10-CM | POA: Diagnosis not present

## 2017-06-01 DIAGNOSIS — Z8371 Family history of colonic polyps: Secondary | ICD-10-CM | POA: Diagnosis not present

## 2017-06-01 DIAGNOSIS — R634 Abnormal weight loss: Secondary | ICD-10-CM

## 2017-06-01 DIAGNOSIS — Z79899 Other long term (current) drug therapy: Secondary | ICD-10-CM | POA: Diagnosis not present

## 2017-06-01 DIAGNOSIS — E785 Hyperlipidemia, unspecified: Secondary | ICD-10-CM | POA: Diagnosis not present

## 2017-06-01 DIAGNOSIS — K317 Polyp of stomach and duodenum: Secondary | ICD-10-CM | POA: Insufficient documentation

## 2017-06-01 HISTORY — PX: BIOPSY: SHX5522

## 2017-06-01 HISTORY — PX: COLONOSCOPY: SHX5424

## 2017-06-01 HISTORY — PX: ESOPHAGOGASTRODUODENOSCOPY: SHX5428

## 2017-06-01 LAB — COMPREHENSIVE METABOLIC PANEL
ALBUMIN: 3.7 g/dL (ref 3.5–5.0)
ALT: 32 U/L (ref 17–63)
AST: 36 U/L (ref 15–41)
Alkaline Phosphatase: 23 U/L — ABNORMAL LOW (ref 38–126)
Anion gap: 7 (ref 5–15)
BUN: 17 mg/dL (ref 6–20)
CHLORIDE: 104 mmol/L (ref 101–111)
CO2: 24 mmol/L (ref 22–32)
CREATININE: 1.35 mg/dL — AB (ref 0.61–1.24)
Calcium: 9.1 mg/dL (ref 8.9–10.3)
GFR calc Af Amer: >60 mL/min (ref >60)
GFR calc non Af Amer: 54 mL/min — ABNORMAL LOW (ref >60)
Glucose, Bld: 74 mg/dL (ref 65–99)
Potassium: 4 mmol/L (ref 3.5–5.1)
SODIUM: 135 mmol/L (ref 135–145)
Total Bilirubin: 0.7 mg/dL (ref 0.3–1.2)
Total Protein: 6.8 g/dL (ref 6.5–8.1)

## 2017-06-01 LAB — CBC WITH DIFFERENTIAL/PLATELET
Basophils Absolute: 0 10*3/uL (ref 0.0–0.1)
Basophils Relative: 1 %
EOS PCT: 2 %
Eosinophils Absolute: 0.1 10*3/uL (ref 0.0–0.7)
HEMATOCRIT: 40.2 % (ref 39.0–52.0)
Hemoglobin: 12.6 g/dL — ABNORMAL LOW (ref 13.0–17.0)
LYMPHS ABS: 1.8 10*3/uL (ref 0.7–4.0)
LYMPHS PCT: 43 %
MCH: 27.9 pg (ref 26.0–34.0)
MCHC: 31.3 g/dL (ref 30.0–36.0)
MCV: 89.1 fL (ref 78.0–100.0)
MONO ABS: 0.3 10*3/uL (ref 0.1–1.0)
Monocytes Relative: 7 %
Neutro Abs: 2 10*3/uL (ref 1.7–7.7)
Neutrophils Relative %: 47 %
PLATELETS: 156 10*3/uL (ref 150–400)
RBC: 4.51 MIL/uL (ref 4.22–5.81)
RDW: 14.5 % (ref 11.5–15.5)
WBC: 4.3 10*3/uL (ref 4.0–10.5)

## 2017-06-01 LAB — TSH: TSH: 4.821 u[IU]/mL — ABNORMAL HIGH (ref 0.350–4.500)

## 2017-06-01 SURGERY — COLONOSCOPY
Anesthesia: Moderate Sedation

## 2017-06-01 MED ORDER — STERILE WATER FOR IRRIGATION IR SOLN
Status: DC | PRN
  Administered 2017-06-01: 13:00:00

## 2017-06-01 MED ORDER — MIDAZOLAM HCL 5 MG/5ML IJ SOLN
INTRAMUSCULAR | Status: DC | PRN
Start: 2017-06-01 — End: 2017-06-01
  Administered 2017-06-01: 1 mg via INTRAVENOUS
  Administered 2017-06-01: 2 mg via INTRAVENOUS
  Administered 2017-06-01: 1 mg via INTRAVENOUS

## 2017-06-01 MED ORDER — MEPERIDINE HCL 100 MG/ML IJ SOLN
INTRAMUSCULAR | Status: AC
  Filled 2017-06-01: qty 2

## 2017-06-01 MED ORDER — MEPERIDINE HCL 100 MG/ML IJ SOLN
INTRAMUSCULAR | Status: DC | PRN
  Administered 2017-06-01 (×2): 25 mg via INTRAVENOUS

## 2017-06-01 MED ORDER — OMEPRAZOLE 20 MG PO CPDR: 20.0000 mg | DELAYED_RELEASE_CAPSULE | Freq: Two times a day (BID) | ORAL | 11 refills | Status: DC

## 2017-06-01 MED ORDER — MIDAZOLAM HCL 5 MG/5ML IJ SOLN
INTRAMUSCULAR | Status: AC
  Filled 2017-06-01: qty 10

## 2017-06-01 MED ORDER — LIDOCAINE VISCOUS 2 % MT SOLN
OROMUCOSAL | Status: AC
  Filled 2017-06-01: qty 15

## 2017-06-01 MED ORDER — SODIUM CHLORIDE 0.9 % IV SOLN
INTRAVENOUS | Status: DC
  Administered 2017-06-01: 12:00:00 via INTRAVENOUS

## 2017-06-01 NOTE — Op Note (Signed)
[facility] Patient Name: Christopher Gates Procedure Date: 06/01/2017 1:13 PM MRN: [id number] Date of Birth: 05/29/1951 Attending MD: Barney Drain MD, MD CSN: [id number] Age: 66 Admit Type: Ambulatory Procedure:                Colonoscopy, DIAGNOSTIC Indications:              Hematochezia, Weight loss Providers:                Barney Drain MD, MD, Otis Peak B. Sharon Seller, RN, Lurline Del, RN Referring MD:             Halford Chessman MD, MD Medicines:                Meperidine 50 mg IV, Midazolam 4 mg IV Complications:            No immediate complications. Estimated Blood Loss:     Estimated blood loss was minimal. Procedure:                Pre-Anesthesia Assessment:                           - Prior to the procedure, a History and Physical                            was performed, and patient medications and                            allergies were reviewed. The patient's tolerance of                            previous anesthesia was also reviewed. The risks                            and benefits of the procedure and the sedation                            options and risks were discussed with the patient.                            All questions were answered, and informed consent                            was obtained. Prior Anticoagulants: The patient has                            taken aspirin, last dose was 1 day prior to                            procedure. ASA Grade Assessment: II - A patient                            with mild systemic disease. After reviewing the  risks and benefits, the patient was deemed in                            satisfactory condition to undergo the procedure.                           After obtaining informed consent, the colonoscope                            was passed under direct vision. Throughout the                            procedure, the patient's blood pressure, pulse, and                    oxygen saturations were monitored continuously. The                            EC-[id number] ([id number]) scope was introduced through                            the anus and advanced to the the cecum, identified                            by appendiceal orifice and ileocecal valve. The                            colonoscopy was technically difficult and complex                            due to significant looping. Successful completion                            of the procedure was aided by increasing the dose                            of sedation medication, changing the patient to a                            supine position, changing the patient to a prone                            position, using manual pressure, straightening and                            shortening the scope to obtain bowel loop reduction                            and COLOWRAP. UNABLE TO TERMINAL ILEUM DUE TO                            LOOPING OF SCOPE IN LEFT COLON. The ileocecal  valve, appendiceal orifice, and rectum were                            photographed. The quality of the bowel preparation                            was good. Scope In: 1:35:14 PM Scope Out: 1:59:02 PM Scope Withdrawal Time: 0 hours 17 minutes 51 seconds  Total Procedure Duration: 0 hours 23 minutes 48 seconds  Findings:      The recto-sigmoid colon, sigmoid colon and descending colon revealed       grossly excessive looping.      The exam was otherwise without abnormality.      External and internal hemorrhoids were found during retroflexion. The       hemorrhoids were moderate. Impression:               - There was significant looping of the LEFT colon.                            UNABLE TO INTUBATE TI.                           - The examination was otherwise normal.                           - INTERMITTENT RECTAL BLEEDING DUE TO internal                            hemorrhoids.                            - NO SOURCE FOR WEIGHT LOSS IDENTIFIED Moderate Sedation:      Moderate (conscious) sedation was administered by the endoscopy nurse       and supervised by the endoscopist. The following parameters were       monitored: oxygen saturation, heart rate, blood pressure, and response       to care. Total physician intraservice time was 45 minutes. Recommendation:           - Repeat colonoscopy in 10 years for surveillance.                           - High fiber diet. CBC/TSH/CMP TODAY. CT SCAN OF                            ABD/PELVIS NEXT WEEK.                           - Continue present medications.                           - Return to my office in 3 months.                           - Patient has a contact number available for  emergencies. The signs and symptoms of potential                            delayed complications were discussed with the                            patient. Return to normal activities tomorrow.                            Written discharge instructions were provided to the                            patient. Procedure Code(s):        --- Professional ---                           [phone], Colonoscopy, flexible; diagnostic, including                            collection of specimen(s) by brushing or washing,                            when performed (separate procedure)                           99152, Moderate sedation services provided by the                            same physician or other qualified health care                            professional performing the diagnostic or                            therapeutic service that the sedation supports,                            requiring the presence of an independent trained                            observer to assist in the monitoring of the                            patient's level of consciousness and physiological                            status; initial 15  minutes of intraservice time,                            patient age 50 years or older                           [phone], Moderate sedation services; each additional  15 minutes intraservice time                           99153, Moderate sedation services; each additional                            15 minutes intraservice time Diagnosis Code(s):        --- Professional ---                           K64.8, Other hemorrhoids                           K92.1, Melena (includes Hematochezia)                           R63.4, Abnormal weight loss CPT copyright 2016 [employer]. All rights reserved. The codes documented in this report are preliminary and upon coder review may  be revised to meet current compliance requirements. Barney Drain, MD Barney Drain MD, MD 06/01/2017 2:39:45 PM This report has been signed electronically. Number of Addenda: 0

## 2017-06-01 NOTE — Discharge Instructions (Signed)
No source for your weight loss was identified. Your RECTAL BLEEDING IS MOST LIKELY DUE TO internal hemorrhoids. You have gastritis MOST LIKELY DUE TO ASPIRIN.  I biopsied your stomach.   WE DREW LABS TODAY AND YOU NEED A CT SCAN NEXT WEEK. The office will call you to schedule CT scan.   DRINK WATER TO KEEP YOUR URINE LIGHT YELLOW.  DRINK ENSURE THREE TIMES A DAY.  FOLLOW A LOW FAT DIET. AVOID ITEMS THAT CAUSE BLOATING. SEE INFO BELOW.  USE FIBER POWDER OR 1 PACKET ONCE DAILY.  CONTINUE OMEPRAZOLE.  INCREASE TO 30 MINUTES PRIOR TO YOUR MEALS TWICE DAILY.  YOUR BIOPSY RESULTS WILL BE AVAILABLE IN MY CHART AFTER NOV 22 AND MY OFFICE WILL CONTACT YOU IN 10-14 DAYS WITH YOUR RESULTS.   FOLLOW UP IN 3 MOS. Follow up with Myrene Buddy February 19 at 10 am  Next colonoscopy in 10 years.   ENDOSCOPY Care After Read the instructions outlined below and refer to this sheet in the next week. These discharge instructions provide you with general information on caring for yourself after you leave the hospital. While your treatment has been planned according to the most current medical practices available, unavoidable complications occasionally occur. If you have any problems or questions after discharge, call DR. FIELDS, [phone].  ACTIVITY  You may resume your regular activity, but move at a slower pace for the next 24 hours.   Take frequent rest periods for the next 24 hours.   Walking will help get rid of the air and reduce the bloated feeling in your belly (abdomen).   No driving for 24 hours (because of the medicine (anesthesia) used during the test).   You may shower.   Do not sign any important legal documents or operate any machinery for 24 hours (because of the anesthesia used during the test).    NUTRITION  Drink plenty of fluids.   You may resume your normal diet as instructed by your doctor.   Begin with a light meal and progress to your normal diet. Heavy or fried  foods are harder to digest and may make you feel sick to your stomach (nauseated).   Avoid alcoholic beverages for 24 hours or as instructed.    MEDICATIONS  You may resume your normal medications.   WHAT YOU CAN EXPECT TODAY  Some feelings of bloating in the abdomen.   Passage of more gas than usual.   Spotting of blood in your stool or on the toilet paper  .  IF YOU HAD POLYPS REMOVED DURING THE ENDOSCOPY:  Eat a soft diet IF YOU HAVE NAUSEA, BLOATING, ABDOMINAL PAIN, OR VOMITING.    FINDING OUT THE RESULTS OF YOUR TEST Not all test results are available during your visit. DR. Oneida Alar WILL CALL YOU WITHIN 14 DAYS OF YOUR PROCEDUE WITH YOUR RESULTS. Do not assume everything is normal if you have not heard from DR. FIELDS IN TWO WEEKS, CALL HER OFFICE AT [phone].  SEEK IMMEDIATE MEDICAL ATTENTION AND CALL THE OFFICE: [phone] IF:  You have more than a spotting of blood in your stool.   Your belly is swollen (abdominal distention).   You are nauseated or vomiting.   You have a temperature over 101F.   You have abdominal pain or discomfort that is severe or gets worse throughout the day.   Gastritis  Gastritis is an inflammation (the body's way of reacting to injury and/or infection) of the stomach. It is often caused by viral or  bacterial (germ) infections. It can also be caused BY ALCOHOL, ASPIRIN, BC/GOODY POWDER'S, (IBUPROFEN) MOTRIN, OR ALEVE (NAPROXEN), chemicals (including alcohol), SPICY FOODS, and medications. This illness may be associated with generalized malaise (feeling tired, not well), UPPER ABDOMINAL STOMACH cramps, and fever. One common bacterial cause of gastritis is an organism known as H. Pylori. This can be treated with antibiotics.    High-Fiber Diet A high-fiber diet changes your normal diet to include more whole grains, legumes, fruits, and vegetables. Changes in the diet involve replacing refined carbohydrates with unrefined foods. The  calorie level of the diet is essentially unchanged. The Dietary Reference Intake (recommended amount) for adult males is 38 grams per day. For adult females, it is 25 grams per day. Pregnant and lactating women should consume 28 grams of fiber per day. Fiber is the intact part of a plant that is not broken down during digestion. Functional fiber is fiber that has been isolated from the plant to provide a beneficial effect in the body. PURPOSE  Increase stool bulk.   Ease and regulate bowel movements.   Lower cholesterol.  INDICATIONS THAT YOU NEED MORE FIBER  Constipation and hemorrhoids.   Uncomplicated diverticulosis (intestine condition) and irritable bowel syndrome.   Weight management.   As a protective measure against hardening of the arteries (atherosclerosis), diabetes, and cancer.   GUIDELINES FOR INCREASING FIBER IN THE DIET  Start adding fiber to the diet slowly. A gradual increase of about 5 more grams (2 slices of whole-wheat bread, 2 servings of most fruits or vegetables, or 1 bowl of high-fiber cereal) per day is best. Too rapid an increase in fiber may result in constipation, flatulence, and bloating.   Drink enough water and fluids to keep your urine clear or pale yellow. Water, juice, or caffeine-free drinks are recommended. Not drinking enough fluid may cause constipation.   Eat a variety of high-fiber foods rather than one type of fiber.   Try to increase your intake of fiber through using high-fiber foods rather than fiber pills or supplements that contain small amounts of fiber.   The goal is to change the types of food eaten. Do not supplement your present diet with high-fiber foods, but replace foods in your present diet.  INCLUDE A VARIETY OF FIBER SOURCES  Replace refined and processed grains with whole grains, canned fruits with fresh fruits, and incorporate other fiber sources. White rice, white breads, and most bakery goods contain little or no fiber.    Brown whole-grain rice, buckwheat oats, and many fruits and vegetables are all good sources of fiber. These include: broccoli, Brussels sprouts, cabbage, cauliflower, beets, sweet potatoes, white potatoes (skin on), carrots, tomatoes, eggplant, squash, berries, fresh fruits, and dried fruits.   Cereals appear to be the richest source of fiber. Cereal fiber is found in whole grains and bran. Bran is the fiber-rich outer coat of cereal grain, which is largely removed in refining. In whole-grain cereals, the bran remains. In breakfast cereals, the largest amount of fiber is found in those with "bran" in their names. The fiber content is sometimes indicated on the label.   You may need to include additional fruits and vegetables each day.   In baking, for 1 cup white flour, you may use the following substitutions:   1 cup whole-wheat flour minus 2 tablespoons.   1/2 cup white flour plus 1/2 cup whole-wheat flour.   Low-Fat Diet BREADS, CEREALS, PASTA, RICE, DRIED PEAS, AND BEANS These products are high in carbohydrates  and most are low in fat. Therefore, they can be increased in the diet as substitutes for fatty foods. They too, however, contain calories and should not be eaten in excess. Cereals can be eaten for snacks as well as for breakfast.  Include foods that contain fiber (fruits, vegetables, whole grains, and legumes). Research shows that fiber may lower blood cholesterol levels, especially the water-soluble fiber found in fruits, vegetables, oat products, and legumes. FRUITS AND VEGETABLES It is good to eat fruits and vegetables. Besides being sources of fiber, both are rich in vitamins and some minerals. They help you get the daily allowances of these nutrients. Fruits and vegetables can be used for snacks and desserts. MEATS Limit lean meat, chicken, [place], and fish to no more than 6 ounces per day. Beef, Pork, and Lamb Use lean cuts of beef, pork, and lamb. Lean cuts include:   Extra-lean ground beef.  Arm roast.  Sirloin tip.  Center-cut ham.  Round steak.  Loin chops.  Rump roast.  Tenderloin.  Trim all fat off the outside of meats before cooking. It is not necessary to severely decrease the intake of red meat, but lean choices should be made. Lean meat is rich in protein and contains a highly absorbable form of iron. Premenopausal women, in particular, should avoid reducing lean red meat because this could increase the risk for low red blood cells (iron-deficiency anemia). The organ meats, such as liver, sweetbreads, kidneys, and brain are very rich in cholesterol. They should be limited. Chicken and [place] These are good sources of protein. The fat of poultry can be reduced by removing the skin and underlying fat layers before cooking. Chicken and [place] can be substituted for lean red meat in the diet. Poultry should not be fried or covered with high-fat sauces. Fish and Shellfish Fish is a good source of protein. Shellfish contain cholesterol, but they usually are low in saturated fatty acids. The preparation of fish is important. Like chicken and [place], they should not be fried or covered with high-fat sauces. EGGS Egg whites contain no fat or cholesterol. They can be eaten often. Try 1 to 2 egg whites instead of whole eggs in recipes or use egg substitutes that do not contain yolk. MILK AND DAIRY PRODUCTS Use skim or 1% milk instead of 2% or whole milk. Decrease whole milk, natural, and processed cheeses. Use nonfat or low-fat (2%) cottage cheese or low-fat cheeses made from vegetable oils. Choose nonfat or low-fat (1 to 2%) yogurt. Experiment with evaporated skim milk in recipes that call for heavy cream. Substitute low-fat yogurt or low-fat cottage cheese for sour cream in dips and salad dressings. Have at least 2 servings of low-fat dairy products, such as 2 glasses of skim (or 1%) milk each day to help get your daily calcium intake.  FATS AND OILS Reduce  the total intake of fats, especially saturated fat. Butterfat, lard, and beef fats are high in saturated fat and cholesterol. These should be avoided as much as possible. Vegetable fats do not contain cholesterol, but certain vegetable fats, such as coconut oil, palm oil, and palm kernel oil are very high in saturated fats. These should be limited. These fats are often used in bakery goods, processed foods, popcorn, oils, and nondairy creamers. Vegetable shortenings and some peanut butters contain hydrogenated oils, which are also saturated fats. Read the labels on these foods and check for saturated vegetable oils. Unsaturated vegetable oils and fats do not raise blood cholesterol. However, they  should be limited because they are fats and are high in calories. Total fat should still be limited to 30% of your daily caloric intake. Desirable liquid vegetable oils are corn oil, cottonseed oil, olive oil, canola oil, safflower oil, soybean oil, and sunflower oil. Peanut oil is not as good, but small amounts are acceptable. Buy a heart-healthy tub margarine that has no partially hydrogenated oils in the ingredients. Mayonnaise and salad dressings often are made from unsaturated fats, but they should also be limited because of their high calorie and fat content. Seeds, nuts, peanut butter, olives, and avocados are high in fat, but the fat is mainly the unsaturated type. These foods should be limited mainly to avoid excess calories and fat. OTHER EATING TIPS Snacks  Most sweets should be limited as snacks. They tend to be rich in calories and fats, and their caloric content outweighs their nutritional value. Some good choices in snacks are graham crackers, melba toast, soda crackers, bagels (no egg), English muffins, fruits, and vegetables. These snacks are preferable to snack crackers, [place] fries, and chips. Popcorn should be air-popped or cooked in small amounts of liquid vegetable oil. Desserts Eat fruit,  low-fat yogurt, and fruit ices. AVOID pastries, cake, and cookies. Sherbet, angel food cake, gelatin dessert, frozen low-fat yogurt, or other frozen products that do not contain saturated fat (pure fruit juice bars, frozen ice pops) are also acceptable.  COOKING METHODS Choose those methods that use little or no fat. They include: Poaching.  Braising.  Steaming.  Grilling.  Baking.  Stir-frying.  Broiling.  Microwaving.  Foods can be cooked in a nonstick pan without added fat, or use a nonfat cooking spray in regular cookware. Limit fried foods and avoid frying in saturated fat. Add moisture to lean meats by using water, broth, cooking wines, and other nonfat or low-fat sauces along with the cooking methods mentioned above. Soups and stews should be chilled after cooking. The fat that forms on top after a few hours in the refrigerator should be skimmed off. When preparing meals, avoid using excess salt. Salt can contribute to raising blood pressure in some people. EATING AWAY FROM HOME Order entres, potatoes, and vegetables without sauces or butter. When meat exceeds the size of a deck of cards (3 to 4 ounces), the rest can be taken home for another meal. Choose vegetable or fruit salads and ask for low-calorie salad dressings to be served on the side. Use dressings sparingly. Limit high-fat toppings, such as bacon, crumbled eggs, cheese, sunflower seeds, and olives. Ask for heart-healthy tub margarine instead of butter.  Polyps, Colon  A polyp is extra tissue that grows inside your body. Colon polyps grow in the large intestine. The large intestine, also called the colon, is part of your digestive system. It is a long, hollow tube at the end of your digestive tract where your body makes and stores stool.Most polyps are not dangerous. They are benign. This means they are not cancerous. But over time, some types of polyps can turn into cancer. Polyps that are smaller than a pea are usually not  harmful. But larger polyps could someday become or may already be cancerous. To be safe, doctors remove all polyps and test them.   PREVENTION There is not one sure way to prevent polyps. You might be able to lower your risk of getting them if you:  Eat more fruits and vegetables and less fatty food.   Do not smoke.   Avoid alcohol.  Exercise every day.   Lose weight if you are overweight.   Eating more calcium and folate can also lower your risk of getting polyps. Some foods that are rich in calcium are milk, cheese, and broccoli. Some foods that are rich in folate are chickpeas, kidney beans, and spinach.    Hemorrhoids Hemorrhoids are dilated (enlarged) veins around the rectum. Sometimes clots will form in the veins. This makes them swollen and painful. These are called thrombosed hemorrhoids. Causes of hemorrhoids include:  Constipation.   Straining to have a bowel movement.   HEAVY LIFTING HOME CARE INSTRUCTIONS  Eat a well balanced diet and drink 6 to 8 glasses of water every day to avoid constipation. You may also use a bulk laxative.   Avoid straining to have bowel movements.   Keep anal area dry and clean.   Do not use a donut shaped pillow or sit on the toilet for long periods. This increases blood pooling and pain.   Move your bowels when your body has the urge; this will require less straining and will decrease pain and pressure.

## 2017-06-01 NOTE — Telephone Encounter (Signed)
CT SCAN OF ABD/PELVIS NEXT WEEK AND OPV IN 3 MOS W/ SLF E30 GASTRITIS/WEIGHT LOSS.

## 2017-06-01 NOTE — Telephone Encounter (Signed)
CT scheduled for 06/11/17 at 8:00am, arrival time 7:45am, NPO 4 hrs prior. Patient needs to p/u contrast w/ instructions. LMOVM

## 2017-06-01 NOTE — H&P (Addendum)
Primary Care Physician:  Sharilyn Sites, MD Primary Gastroenterologist:  Dr. Oneida Alar  Pre-Procedure History & Physical: HPI:  Christopher Gates is a 66 y.o. male here for WEIGHT LOSS.  Past Medical History:  Diagnosis Date  . GERD (gastroesophageal reflux disease)   . Hyperlipidemia   . Shoulder fracture, left     Past Surgical History:  Procedure Laterality Date  . facial cysts?      Prior to Admission medications   Medication Sig Start Date End Date Taking? Authorizing Provider  aspirin EC 325 MG tablet Take 325 mg daily by mouth. (0700)   Yes [provider]  carboxymethylcellulose (REFRESH TEARS) 0.5 % SOLN Place 1 drop 3 (three) times daily as needed into both eyes (for dry/irritated eyes).   Yes [provider]  Na Sulfate-K Sulfate-Mg Sulf (SUPREP BOWEL PREP KIT) 17.5-3.13-1.6 GM/180ML SOLN Take 1 kit by mouth as directed. 04/14/17  Yes Aveer Bartow, Marga Melnick, MD  Omega-3 Fatty Acids (FISH OIL) 1000 MG CAPS Take 1,000 mg daily by mouth. (0700)   Yes [provider]  omeprazole (PRILOSEC) 20 MG capsule Take 20 mg daily by mouth. (0700)   Yes [provider]  simvastatin (ZOCOR) 40 MG tablet Take 40 mg at bedtime by mouth. (2000)   Yes [provider]    Allergies as of 04/14/2017  . (No Known Allergies)    Family History  Problem Relation Age of Onset  . Colon cancer Neg Hx   . Colon polyps Neg Hx     Social History   Socioeconomic History  . Marital status: Single    Spouse name: Not on file  . Number of children: Not on file  . Years of education: Not on file  . Highest education level: Not on file  Social Needs  . Financial resource strain: Not on file  . Food insecurity - worry: Not on file  . Food insecurity - inability: Not on file  . Transportation needs - medical: Not on file  . Transportation needs - non-medical: Not on file  Occupational History  . Not on file  Tobacco Use  . Smoking status: Never Smoker  .  Smokeless tobacco: Never Used  Substance and Sexual Activity  . Alcohol use: No  . Drug use: No  . Sexual activity: Not on file  Other Topics Concern  . Not on file  Social History Narrative  . Not on file    Review of Systems: See HPI, otherwise negative ROS   Physical Exam: BP 116/79   Pulse 64   Temp 97.6 F (36.4 C) (Oral)   Resp 14   Ht 5' 10.5" (1.791 m)   Wt 155 lb (70.3 kg)   SpO2 100%   BMI 21.93 kg/m  General:   Alert,  pleasant and cooperative in NAD Head:  Normocephalic and atraumatic. Neck:  Supple; Lungs:  Clear throughout to auscultation.    Heart:  Regular rate and irregular rhythm. Abdomen:  Soft, nontender and nondistended. Normal bowel sounds, without guarding, and without rebound.   Neurologic:  Alert and  oriented x4;  grossly normal neurologically.  Impression/Plan:     WEIGHT LOSS  Plan:  1. TCS/POSSIBLE EGD TODAY DISCUSSED PROCEDURE, BENEFITS, & RISKS: < 1% chance of medication reaction, bleeding, perforation, or rupture of spleen/liver.

## 2017-06-01 NOTE — Op Note (Signed)
[facility] Patient Name: Christopher Gates Procedure Date: 06/01/2017 2:08 PM MRN: [id number] Date of Birth: 1951-05-21 Attending MD: Barney Drain MD, MD CSN: [id number] Age: 66 Admit Type: Ambulatory Procedure:                Upper GI endoscopy WITH COLD FORCEPS BIOPSY Indications:              Weight loss. PT TAKES ASA DAILY. Providers:                Barney Drain MD, MD, Gwenlyn Fudge RN, RN, Lurline Del, RN Referring MD:             Halford Chessman MD, MD Medicines:                TCS + None Complications:            No immediate complications. Estimated Blood Loss:     Estimated blood loss was minimal. Procedure:                Pre-Anesthesia Assessment:                           - Prior to the procedure, a History and Physical                            was performed, and patient medications and                            allergies were reviewed. The patient's tolerance of                            previous anesthesia was also reviewed. The risks                            and benefits of the procedure and the sedation                            options and risks were discussed with the patient.                            All questions were answered, and informed consent                            was obtained. Prior Anticoagulants: The patient has                            taken aspirin, last dose was 1 day prior to                            procedure. ASA Grade Assessment: II - A patient                            with mild systemic disease. After reviewing the  risks and benefits, the patient was deemed in                            satisfactory condition to undergo the procedure.                            After obtaining informed consent, the endoscope was                            passed under direct vision. Throughout the                            procedure, the patient's blood pressure, pulse, and               oxygen saturations were monitored continuously. The                            [id number] ([id number]) scope was introduced through the                            mouth, and advanced to the second part of duodenum.                            The upper GI endoscopy was somewhat difficult due                            to a J-shaped stomach which made pyloric intubation                            difficult and the patient's agitation. Successful                            completion of the procedure was aided by                            straightening and shortening the scope to obtain                            bowel loop reduction. The patient tolerated the                            procedure fairly well. Scope In: 2:12:39 PM Scope Out: 2:20:19 PM Total Procedure Duration: 0 hours 7 minutes 40 seconds  Findings:      One mild (non-circumferential scarring) benign-appearing, intrinsic       stenosis was found. This measured 1.4 cm (inner diameter) and was       traversed.      A single 4 mm sessile polyp with no stigmata of recent bleeding was       found in the cardia. The polyp was removed with a cold biopsy forceps.       Resection and retrieval were complete.      Diffuse moderate inflammation characterized by congestion (edema) and       erythema was found in the entire examined stomach. Biopsies were taken  with a cold forceps for Helicobacter pylori testing.      The examined duodenum was normal. Impression:               - Benign-appearing esophageal stenosis.                           - A single gastric polyp. Resected and retrieved.                           - MODERATE Gastritis. Biopsied.                           - NO OBVIOUS SOURCE FOR WEIGHT LOSS IDENTIFIED.                            ALTHOUGH H PYLORI GASTRITIS COULD CONTRIBUTE TO                            WEIGHT LOSS, LESS LIKLEY DUE TO PEPTIC STRICTURE Moderate Sedation:      Moderate (conscious) sedation  was administered by the endoscopy nurse       and supervised by the endoscopist. The following parameters were       monitored: oxygen saturation, heart rate, blood pressure, and response       to care. Total physician intraservice time was 45 minutes. Recommendation:           - Await pathology results. CBC/CMP/TSH TODAY. CT                            SCAN ABD/PELVIS NEXT WEEK.                           - Return to my office in 3 months.                           - Use Prilosec (omeprazole) 20 mg PO BID.                           - Resume previous diet.                           - Patient has a contact number available for                            emergencies. The signs and symptoms of potential                            delayed complications were discussed with the                            patient. Return to normal activities tomorrow.                            Written discharge instructions were provided to the  patient.                           - Continue present medications. Procedure Code(s):        --- Professional ---                           [phone], Esophagogastroduodenoscopy, flexible,                            transoral; with biopsy, single or multiple                           99152, Moderate sedation services provided by the                            same physician or other qualified health care                            professional performing the diagnostic or                            therapeutic service that the sedation supports,                            requiring the presence of an independent trained                            observer to assist in the monitoring of the                            patient's level of consciousness and physiological                            status; initial 15 minutes of intraservice time,                            patient age 87 years or older                           [phone], Moderate sedation  services; each additional                            15 minutes intraservice time                           99153, Moderate sedation services; each additional                            15 minutes intraservice time Diagnosis Code(s):        --- Professional ---                           K22.2, Esophageal obstruction  K31.7, Polyp of stomach and duodenum                           K29.70, Gastritis, unspecified, without bleeding                           R63.4, Abnormal weight loss CPT copyright 2016 American Medical Association. All rights reserved. The codes documented in this report are preliminary and upon coder review may  be revised to meet current compliance requirements. Barney Drain, MD Barney Drain MD, MD 06/01/2017 2:51:22 PM This report has been signed electronically. Number of Addenda: 0

## 2017-06-02 ENCOUNTER — Telehealth: Payer: Self-pay | Admitting: Gastroenterology

## 2017-06-02 ENCOUNTER — Encounter: Payer: Self-pay | Admitting: *Deleted

## 2017-06-02 DIAGNOSIS — R634 Abnormal weight loss: Secondary | ICD-10-CM

## 2017-06-02 NOTE — Telephone Encounter (Signed)
Called DR. NIDA TO DISCUSS TSH RESULTS.

## 2017-06-02 NOTE — Telephone Encounter (Signed)
Spoke with pt caregiver Olegario Shearer (dpr on file) and is aware of appt details. She will go by and pick up contrast for pt. Letter also mailed.

## 2017-06-08 ENCOUNTER — Telehealth: Payer: Self-pay | Admitting: Gastroenterology

## 2017-06-08 ENCOUNTER — Encounter (HOSPITAL_COMMUNITY): Payer: Self-pay | Admitting: Gastroenterology

## 2017-06-08 NOTE — Telephone Encounter (Signed)
Please call pt. His stomach Bx shows mild gastritis DUE TO ASA. HIS THYROID TEST WAS SLIGHTLY ABNORMAL. HE NEEDS ADDITIONAL LABS  DRAWN.   YOU NEED A CT SCAN ABD/PELVIS  THIS WEEK  DRINK WATER TO KEEP YOUR URINE LIGHT YELLOW.  DRINK ENSURE THREE TIMES A DAY.  USE FIBER POWDER OR 1 PACKET ONCE DAILY.  CONTINUE OMEPRAZOLE 30 MINUTES PRIOR TO YOUR MEALS TWICE DAILY.   FOLLOW UP February 19 at 10 am.  Next colonoscopy in 10 years.

## 2017-06-08 NOTE — Telephone Encounter (Signed)
[phone] please call patient if his procedure results are in

## 2017-06-08 NOTE — Telephone Encounter (Signed)
SEE TC NOV 20.

## 2017-06-08 NOTE — Telephone Encounter (Signed)
Forwarding to Dr. Fields for results.  

## 2017-06-09 NOTE — Telephone Encounter (Signed)
Christopher Gates is aware and said the Ct is scheduled for Thursday.

## 2017-06-09 NOTE — Telephone Encounter (Signed)
PATIENT SCHEDULED AND ON RECALL  °

## 2017-06-10 DIAGNOSIS — F70 Mild intellectual disabilities: Secondary | ICD-10-CM | POA: Diagnosis not present

## 2017-06-10 DIAGNOSIS — I82402 Acute embolism and thrombosis of unspecified deep veins of left lower extremity: Secondary | ICD-10-CM | POA: Diagnosis not present

## 2017-06-10 DIAGNOSIS — E782 Mixed hyperlipidemia: Secondary | ICD-10-CM | POA: Diagnosis not present

## 2017-06-10 DIAGNOSIS — R634 Abnormal weight loss: Secondary | ICD-10-CM | POA: Diagnosis not present

## 2017-06-10 DIAGNOSIS — E781 Pure hyperglyceridemia: Secondary | ICD-10-CM | POA: Diagnosis not present

## 2017-06-10 DIAGNOSIS — Z681 Body mass index (BMI) 19 or less, adult: Secondary | ICD-10-CM | POA: Diagnosis not present

## 2017-06-10 DIAGNOSIS — K219 Gastro-esophageal reflux disease without esophagitis: Secondary | ICD-10-CM | POA: Diagnosis not present

## 2017-06-11 ENCOUNTER — Ambulatory Visit (HOSPITAL_COMMUNITY)
Admit: 2017-06-11 | Discharge: 2017-06-11 | Disposition: A | Discharge status: Home / Self Care | Payer: Medicare Other | Source: Ambulatory Visit | Attending: Gastroenterology | Admitting: Gastroenterology

## 2017-06-11 DIAGNOSIS — R634 Abnormal weight loss: Secondary | ICD-10-CM | POA: Diagnosis not present

## 2017-06-11 DIAGNOSIS — Z6821 Body mass index (BMI) 21.0-21.9, adult: Secondary | ICD-10-CM | POA: Diagnosis not present

## 2017-06-11 DIAGNOSIS — M419 Scoliosis, unspecified: Secondary | ICD-10-CM | POA: Insufficient documentation

## 2017-06-11 MED ORDER — IOPAMIDOL (ISOVUE-300) INJECTION 61%
100.0000 mL | Freq: Once | INTRAVENOUS | Status: AC | PRN
  Administered 2017-06-11: 100 mL via INTRAVENOUS

## 2017-06-11 NOTE — Progress Notes (Signed)
Pt's caregiver, Olegario Shearer is aware. She requested a copy of the CT report for her records. I am mailing it to her along with the lab orders ( see previous note), and she will have them done at their earliest convenience.

## 2017-06-11 NOTE — Progress Notes (Signed)
cc'd to pcp 

## 2017-06-24 DIAGNOSIS — H401132 Primary open-angle glaucoma, bilateral, moderate stage: Secondary | ICD-10-CM | POA: Diagnosis not present

## 2017-09-01 ENCOUNTER — Encounter: Payer: Self-pay | Admitting: Gastroenterology

## 2017-09-01 ENCOUNTER — Ambulatory Visit (INDEPENDENT_AMBULATORY_CARE_PROVIDER_SITE_OTHER): Payer: Medicare Other | Admitting: Gastroenterology

## 2017-09-01 VITALS — BP 99/61 | HR 66 | Temp 97.2°F | Ht 69.0 in | Wt 154.6 lb

## 2017-09-01 DIAGNOSIS — R634 Abnormal weight loss: Secondary | ICD-10-CM

## 2017-09-01 DIAGNOSIS — N189 Chronic kidney disease, unspecified: Secondary | ICD-10-CM

## 2017-09-01 DIAGNOSIS — D631 Anemia in chronic kidney disease: Secondary | ICD-10-CM | POA: Diagnosis not present

## 2017-09-01 DIAGNOSIS — R7989 Other specified abnormal findings of blood chemistry: Secondary | ICD-10-CM

## 2017-09-01 DIAGNOSIS — K2950 Unspecified chronic gastritis without bleeding: Secondary | ICD-10-CM

## 2017-09-01 NOTE — Progress Notes (Signed)
cc'ed to pcp °

## 2017-09-01 NOTE — Assessment & Plan Note (Signed)
Doing very well. No further weight loss. Work up included TCS/EGD and CT A/P. He had slightly elevated TSH and did not complete further thyroid labs as recommended. Will complete at this time. Caregiver requesting to stop Ensure as it is not covered by Medicaid. Advised to stop but monitor for weight loss and call if greater than five pound drop.   Gastritis found at time of EGD. Increased omeprazole to bid. No complaints. Mild anemia likely due to anemia of chronic disease. No evidence of IDA. F/u labs.   Return to the office in four months.

## 2017-09-01 NOTE — Patient Instructions (Signed)
1. You may stop Ensure since there is a financial concern however, monitor weight at least twice monthly and notify us if he drops more than 5 pounds.  2. Please have labs done at [facility] [place]: Free T4, Free T3, TSH, CBC.  3. Return to the office in 4 months to see Dr. Oneida Alar.

## 2017-09-01 NOTE — Progress Notes (Signed)
Primary Care Physician: Sharilyn Sites, MD  Primary Gastroenterologist:  Barney Drain, MD   Chief Complaint  Patient presents with  . loss of weight    f/u. Improving    HPI: Christopher Gates is a 67 y.o. male here follow-up. He is seen back in October to schedule a colonoscopy as well as concern for 8 pound weight loss in the prior 10 months. Patient was without any complaints.  He underwent a colonoscopy and upper endoscopy for weight loss. He had gastritis but no H. pylori. Gastric polyp removed which was benign fund gland polyp, benign appearing esophageal stenosis. On colonoscopy and extensive looping of the left colon, terminal ileum cannot be intubated, remaining colon was unremarkable, internal and external hemorrhoids noted. Next colonoscopy in 10 years.  Patient also had a CT of the abdomen and pelvis with contrast evaluate weight loss. Nothing found to explain weight loss. Unremarkable exam except for scoliosis of the spine, bilateral small inguinal hernias. Patient's TSH was slightly abnormal at 4.821. He was supposed to have further labs done but he did not complete.   Weight has been stable since October 2018.   Patient clinically is doing well. No complaints. Caregiver reports he seems to be doing well. There is concern about expense of the Ensure and if it is necessary. Its not covered by Medicaid.   Current Outpatient Medications  Medication Sig Dispense Refill  . aspirin EC 325 MG tablet Take 325 mg daily by mouth. (0700)    . carboxymethylcellulose (REFRESH TEARS) 0.5 % SOLN Place 1 drop 3 (three) times daily as needed into both eyes (for dry/irritated eyes).    . Omega-3 Fatty Acids (FISH OIL) 1000 MG CAPS Take 1,000 mg daily by mouth. (0700)    . omeprazole (PRILOSEC) 20 MG capsule Take 1 capsule (20 mg total) 2 (two) times daily before a meal by mouth. (0700) 60 capsule 11  . simvastatin (ZOCOR) 40 MG tablet Take 40 mg at bedtime by mouth. (2000)     No  current facility-administered medications for this visit.     Allergies as of 09/01/2017  . (No Known Allergies)    ROS:  General: Negative for anorexia, weight loss, fever, chills, fatigue, weakness. ENT: Negative for hoarseness, difficulty swallowing , nasal congestion. CV: Negative for chest pain, angina, palpitations, dyspnea on exertion, peripheral edema.  Respiratory: Negative for dyspnea at rest, dyspnea on exertion, cough, sputum, wheezing.  GI: See history of present illness. GU:  Negative for dysuria, hematuria, urinary incontinence, urinary frequency, nocturnal urination.  Endo: Negative for unusual weight change.    Physical Examination:   BP 99/61   Pulse 66   Temp (!) 97.2 F (36.2 C) (Oral)   Ht 5\' 9"  (1.753 m)   Wt 154 lb 9.6 oz (70.1 kg)   BMI 22.83 kg/m   General: Well-nourished, well-developed in no acute distress.  Eyes: No icterus. Mouth: Oropharyngeal mucosa moist and pink , no lesions erythema or exudate. Lungs: Clear to auscultation bilaterally.  Heart: Regular rate and rhythm, no murmurs rubs or gallops.  Abdomen: Bowel sounds are normal, nontender, nondistended, no hepatosplenomegaly or masses, no abdominal bruits or hernia , no rebound or guarding.   Extremities: No lower extremity edema. No clubbing or deformities. Neuro: Alert and oriented x 4   Skin: Warm and dry, no jaundice.   Psych: Alert and cooperative, normal mood and affect.  Labs:  Lab Results  Component Value Date   WBC 4.3  06/01/2017   HGB 12.6 (L) 06/01/2017   HCT 40.2 06/01/2017   MCV 89.1 06/01/2017   PLT 156 06/01/2017   Lab Results  Component Value Date   CREATININE 1.35 (H) 06/01/2017   BUN 17 06/01/2017   NA 135 06/01/2017   K 4.0 06/01/2017   CL 104 06/01/2017   CO2 24 06/01/2017   Lab Results  Component Value Date   ALT 32 06/01/2017   AST 36 06/01/2017   ALKPHOS 23 (L) 06/01/2017   BILITOT 0.7 06/01/2017   Lab Results  Component Value Date   TSH 4.821  (H) 06/01/2017     Labs from lab report dated September 2018 with B12 of 825, folate greater than 20, ferritin 207, TR BC slightly low at 229, iron 48, iron saturation is 21%, TSH 1.950, white blood cell count 4800, hemoglobin 12.7 slightly low, hematocrit 37.5 slightly low, MCV 84, platelets 251,000, creatinine 1.51 Imaging Studies: No results found.

## 2017-09-03 DIAGNOSIS — K2950 Unspecified chronic gastritis without bleeding: Secondary | ICD-10-CM | POA: Diagnosis not present

## 2017-09-03 DIAGNOSIS — D631 Anemia in chronic kidney disease: Secondary | ICD-10-CM | POA: Diagnosis not present

## 2017-09-03 DIAGNOSIS — R7989 Other specified abnormal findings of blood chemistry: Secondary | ICD-10-CM | POA: Diagnosis not present

## 2017-09-03 DIAGNOSIS — R634 Abnormal weight loss: Secondary | ICD-10-CM | POA: Diagnosis not present

## 2017-09-03 DIAGNOSIS — N189 Chronic kidney disease, unspecified: Secondary | ICD-10-CM | POA: Diagnosis not present

## 2017-09-04 LAB — CBC
HEMATOCRIT: 41.8 % (ref 37.5–51.0)
HEMOGLOBIN: 13.6 g/dL (ref 13.0–17.7)
MCH: 28.1 pg (ref 26.6–33.0)
MCHC: 32.5 g/dL (ref 31.5–35.7)
MCV: 86 fL (ref 79–97)
Platelets: 175 10*3/uL (ref 150–379)
RBC: 4.84 x10E6/uL (ref 4.14–5.80)
RDW: 14.7 % (ref 12.3–15.4)
WBC: 4.6 10*3/uL (ref 3.4–10.8)

## 2017-09-04 LAB — TSH+FREE T4
FREE T4: 0.9 ng/dL (ref 0.82–1.77)
TSH: 3.09 u[IU]/mL (ref 0.450–4.500)

## 2017-09-04 LAB — T3, FREE: T3, Free: 2.8 pg/mL (ref 2.0–4.4)

## 2017-09-06 NOTE — Progress Notes (Signed)
Please let caregiver know his thyroid test and hgb are normal. See back in 4 months. Let us know if any concerns arise.

## 2017-09-08 NOTE — Progress Notes (Signed)
I called and informed caregiver, Miachel Roux. I am printing this and mailing to her per her request for his records.

## 2017-11-11 ENCOUNTER — Encounter: Payer: Self-pay | Admitting: Gastroenterology

## 2017-12-09 ENCOUNTER — Telehealth: Payer: Self-pay

## 2017-12-09 MED ORDER — OMEPRAZOLE 20 MG PO CPDR: 20.0000 mg | DELAYED_RELEASE_CAPSULE | Freq: Two times a day (BID) | ORAL | 3 refills | Status: DC

## 2017-12-09 NOTE — Telephone Encounter (Signed)
Done

## 2017-12-09 NOTE — Telephone Encounter (Signed)
Letter received, pts insurance will cover a 90 day supply of Omeprazole 20mg  at a less expensive price for pt. Rx for 90 day supply is requested at [facility]. Last fill date 10/19/17.

## 2017-12-09 NOTE — Addendum Note (Signed)
Addended by: Annitta Needs on: 12/09/2017 04:20 PM   Modules accepted: Orders

## 2017-12-23 DIAGNOSIS — H401132 Primary open-angle glaucoma, bilateral, moderate stage: Secondary | ICD-10-CM | POA: Diagnosis not present

## 2018-03-10 ENCOUNTER — Ambulatory Visit (INDEPENDENT_AMBULATORY_CARE_PROVIDER_SITE_OTHER): Payer: Medicare Other | Admitting: Gastroenterology

## 2018-03-10 ENCOUNTER — Encounter: Payer: Self-pay | Admitting: Gastroenterology

## 2018-03-10 DIAGNOSIS — K5901 Slow transit constipation: Secondary | ICD-10-CM

## 2018-03-10 DIAGNOSIS — K5900 Constipation, unspecified: Secondary | ICD-10-CM | POA: Insufficient documentation

## 2018-03-10 DIAGNOSIS — K219 Gastro-esophageal reflux disease without esophagitis: Secondary | ICD-10-CM | POA: Diagnosis not present

## 2018-03-10 DIAGNOSIS — R634 Abnormal weight loss: Secondary | ICD-10-CM

## 2018-03-10 DIAGNOSIS — K2950 Unspecified chronic gastritis without bleeding: Secondary | ICD-10-CM | POA: Diagnosis not present

## 2018-03-10 NOTE — Progress Notes (Addendum)
   Subjective:    Patient ID: Christopher Gates, male    DOB: 1951/05/28, 67 y.o.   MRN: [id number]  HPI PT HAS No questions or concerns. Weight stable.  PT DENIES FEVER, CHILLS, HEMATOCHEZIA, HEMATEMESIS, nausea, vomiting, melena, diarrhea, CHEST PAIN, SHORTNESS OF BREATH,  CHANGE IN BOWEL IN HABITS, constipation, abdominal pain, problems swallowing, problems with sedation, heartburn or indigestion.   Past Medical History:  Diagnosis Date  . GERD (gastroesophageal reflux disease)   . Hyperlipidemia   . Shoulder fracture, left     Past Surgical History:  Procedure Laterality Date  . BIOPSY  06/01/2017   Procedure: BIOPSY;  Surgeon: Danie Binder, MD;  Location: [facility];  Service: Endoscopy;;  gastric   . COLONOSCOPY N/A 06/01/2017   Dr. Oneida Alar: extensive looping of the left colon, cannot intubate the terminal ileum, internal and external hemorrhoids. Next colonoscopy in 10 years.  . ESOPHAGOGASTRODUODENOSCOPY N/A 06/01/2017   Dr. Oneida Alar: gastritis with no H. pylori. benign appearing esophageal stenosis, fundic gland polyp removed.   . facial cysts?     No Known Allergies  Current Outpatient Medications  Medication Sig    . aspirin EC 325 MG tablet Take 325 mg daily by mouth. (0700)    . carboxymethylcellulose (REFRESH TEARS) 0.5 % SOLN Place 1 drop 3 (three) times daily as needed into both eyes (for dry/irritated eyes).    . Omega-3 Fatty Acids (FISH OIL) 1000 MG CAPS Take 1,000 mg daily by mouth. (0700)    . omeprazole (PRILOSEC) 20 MG capsule Take 1 capsule (20 mg total) by mouth 2 (two) times daily before a meal. (0700)    . simvastatin (ZOCOR) 40 MG tablet Take 40 mg at bedtime by mouth. (2000)     Review of Systems PER HPI OTHERWISE ALL SYSTEMS ARE NEGATIVE.     Objective:   Physical Exam  Constitutional: He is oriented to person, place, and time. He appears well-developed and well-nourished. No distress.  HENT:  Head: Normocephalic and atraumatic.    Mouth/Throat: Oropharynx is clear and moist. No oropharyngeal exudate.  Eyes: Pupils are equal, round, and reactive to light. No scleral icterus.  Neck: Normal range of motion. Neck supple.  Cardiovascular: Normal rate, regular rhythm and normal heart sounds.  Pulmonary/Chest: Effort normal and breath sounds normal. No respiratory distress.  Abdominal: Soft. Bowel sounds are normal. He exhibits no distension. There is no tenderness.  Musculoskeletal: He exhibits no edema.  Lymphadenopathy:    He has no cervical adenopathy.  Neurological: He is alert and oriented to person, place, and time.  Psychiatric: He has a normal mood and affect.  Vitals reviewed.         Assessment & Plan:

## 2018-03-10 NOTE — Assessment & Plan Note (Signed)
WEIGHT STABLE SINCE OCT 2018.  CONTINUE TO MONITOR SYMPTOMS.

## 2018-03-10 NOTE — Progress Notes (Signed)
ON RECALL  °

## 2018-03-10 NOTE — Assessment & Plan Note (Signed)
SYMPTOMS CONTROLLED/RESOLVED.  CONTINUE TO MONITOR SYMPTOMS. DRINK WATER TO KEEP YOUR URINE LIGHT YELLOW. TO PREVENT UNCONTROLLED HEARTBURN, ULCERS, AND GASTRITIS DUE TO DAILY ASPIRIN USE, CONTINUE OMEPRAZOLE.  TAKE 30 MINUTES PRIOR TO YOUR MEALS ONCE OR TWICE DAILY.  FOLLOW UP IN 1 YEAR.

## 2018-03-10 NOTE — Patient Instructions (Signed)
DRINK WATER TO KEEP YOUR URINE LIGHT YELLOW.  FOLLOW A HIGH FIBER DIET.   Continue FIBER SUPPLEMENTS.  TO PREVENT UNCONTROLLED HEARTBURN, ULCERS, AND GASTRITIS DUE TO DAILY ASPIRIN USE, CONTINUE OMEPRAZOLE.  TAKE 30 MINUTES PRIOR TO YOUR MEALS ONCE OR TWICE DAILY.  FOLLOW UP IN 1 YEAR.

## 2018-03-10 NOTE — Assessment & Plan Note (Signed)
ON ASA DAILY. SYMPTOMS CONTROLLED/RESOLVED.  DRINK WATER TO KEEP YOUR URINE LIGHT YELLOW. TO PREVENT ULCERS, AND GASTRITIS DUE TO DAILY ASPIRIN USE, CONTINUE OMEPRAZOLE.  TAKE 30 MINUTES PRIOR TO YOUR MEALS ONCE OR TWICE DAILY.  FOLLOW UP IN 1 YEAR.

## 2018-03-10 NOTE — Assessment & Plan Note (Signed)
SYMPTOMS CONTROLLED/RESOLVED.  DRINK WATER TO KEEP YOUR URINE LIGHT YELLOW. FOLLOW A HIGH FIBER DIET.  Continue FIBER SUPPLEMENTS. FOLLOW UP IN 1 YEAR.

## 2018-03-10 NOTE — Progress Notes (Signed)
CC'ED TO PCP 

## 2018-03-18 ENCOUNTER — Telehealth: Payer: Self-pay

## 2018-03-18 MED ORDER — OMEPRAZOLE 20 MG PO CPDR: DELAYED_RELEASE_CAPSULE | ORAL | 3 refills | Status: DC

## 2018-03-18 NOTE — Telephone Encounter (Signed)
PLEASE CALL [facility]. PT SHOULD TAKE FIBER SUPPLEMENT 1 TBSP DAILY. FOLLOW A HIGH FIBER DIET(AT LEAST 38-40 GMS DAILY). CONTINUE OMEPRAZOLE.  TAKE 30 MINUTES PRIOR TO HIS FIRST MEAL. HE ONLY NEEDS A WEIGHT CHECK ONCE A MONTH, THEY SHOULD CALL IF HE HAS A 5 LBS WEIGHT LOSS.

## 2018-03-18 NOTE — Telephone Encounter (Signed)
PLEASE CALL PT.  Rx sent.  

## 2018-03-18 NOTE — Telephone Encounter (Signed)
Called and informed Olegario Shearer, the caregiver.

## 2018-03-18 NOTE — Telephone Encounter (Signed)
I called and informed Christopher Gates. I am mailing a High Fiber Diet. She is requesting a new prescription for the Omeprazole once a day dosing to be sent to pharmacy.

## 2018-03-18 NOTE — Telephone Encounter (Signed)
T/C from [employer] [phone]) from the [place] [employer],   She is checking pt's chart @ [facility] and has some questions.  1. What fiber supplement and how much fiber  should pt be on?      The caregiver bought an over the counter Fiber and started given one tablespoon daily.  2. Was patient given a high fiber diet? Did caregiver ask any questions?   3. Please clarify if pt is to have Omeprazole once or twice a day.   4. In February 2019 pt was instructed to stop Ensure and to monitor for weight loss by checking weight x 2 monthly, and to call the office if he had greater than a 5 lb loss. IS PT TO STILL HAVE WEIGHT CHECKS AT THE HOME MONTHLY. PLEASE ADVISE!  Forwarding to Dr.Fields to advise!

## 2018-03-23 ENCOUNTER — Telehealth: Payer: Self-pay | Admitting: Gastroenterology

## 2018-03-23 MED ORDER — PSYLLIUM 58.6 % PO POWD: 1.0000 | Freq: Every day | ORAL | 12 refills | Status: DC

## 2018-03-23 NOTE — Telephone Encounter (Signed)
Forwarding to Dr.Fields.  

## 2018-03-23 NOTE — Addendum Note (Signed)
Addended by: Danie Binder on: 03/23/2018 01:49 PM   Modules accepted: Orders

## 2018-03-23 NOTE — Telephone Encounter (Signed)
[phone] please call patient about his fiber.  Was told he is to take it daily  and [facility]

## 2018-03-23 NOTE — Telephone Encounter (Signed)
PLEASE CALL PT'S FACILITY. RX FOR METAMUCIL SENT.

## 2018-03-23 NOTE — Telephone Encounter (Signed)
Called and informed Christopher Gates at the facility.

## 2018-03-25 ENCOUNTER — Telehealth: Payer: Self-pay

## 2018-03-25 NOTE — Telephone Encounter (Addendum)
Called and informed [facility] at [facility].

## 2018-03-25 NOTE — Telephone Encounter (Signed)
PLEASE CALL PT's pharmacy. Ok to substitute.

## 2018-03-25 NOTE — Telephone Encounter (Signed)
T/C from Christopher Gates at [place], they are unable to get the smooth texture Metamucil. They have tried several different places.  He wants to know if OK to do the regular Metamucil.  Dr. Oneida Alar, please advise!

## 2018-04-05 DIAGNOSIS — Z23 Encounter for immunization: Secondary | ICD-10-CM | POA: Diagnosis not present

## 2018-04-05 DIAGNOSIS — Z0001 Encounter for general adult medical examination with abnormal findings: Secondary | ICD-10-CM | POA: Diagnosis not present

## 2018-04-05 DIAGNOSIS — Z6824 Body mass index (BMI) 24.0-24.9, adult: Secondary | ICD-10-CM | POA: Diagnosis not present

## 2018-04-05 DIAGNOSIS — Z1389 Encounter for screening for other disorder: Secondary | ICD-10-CM | POA: Diagnosis not present

## 2018-04-12 ENCOUNTER — Telehealth: Payer: Self-pay

## 2018-04-12 NOTE — Telephone Encounter (Signed)
Miachel Roux, the caregiver, came by the office for some documentation about the fiber diet. I had mailed her a fiber diet, but she needed pt's name etc written on the top. I wrote his name, dob, and stamped Dr. Nona Dell name on it. I also printed the last AVS and highlighted the high fiber diet on it for her records.  They were able to get the metamucil and she has started pt on it.

## 2018-04-13 NOTE — Telephone Encounter (Signed)
REVIEWED-NO ADDITIONAL RECOMMENDATIONS. 

## 2018-05-19 DIAGNOSIS — E782 Mixed hyperlipidemia: Secondary | ICD-10-CM | POA: Diagnosis not present

## 2018-05-19 DIAGNOSIS — I82402 Acute embolism and thrombosis of unspecified deep veins of left lower extremity: Secondary | ICD-10-CM | POA: Diagnosis not present

## 2018-05-19 DIAGNOSIS — Z6825 Body mass index (BMI) 25.0-25.9, adult: Secondary | ICD-10-CM | POA: Diagnosis not present

## 2018-05-19 DIAGNOSIS — E663 Overweight: Secondary | ICD-10-CM | POA: Diagnosis not present

## 2018-05-19 DIAGNOSIS — F70 Mild intellectual disabilities: Secondary | ICD-10-CM | POA: Diagnosis not present

## 2018-05-19 DIAGNOSIS — Z23 Encounter for immunization: Secondary | ICD-10-CM | POA: Diagnosis not present

## 2018-05-19 DIAGNOSIS — K219 Gastro-esophageal reflux disease without esophagitis: Secondary | ICD-10-CM | POA: Diagnosis not present

## 2018-05-19 DIAGNOSIS — Z125 Encounter for screening for malignant neoplasm of prostate: Secondary | ICD-10-CM | POA: Diagnosis not present

## 2018-05-29 DIAGNOSIS — Z1211 Encounter for screening for malignant neoplasm of colon: Secondary | ICD-10-CM | POA: Diagnosis not present

## 2018-06-15 ENCOUNTER — Telehealth: Payer: Self-pay

## 2018-06-15 MED ORDER — OMEPRAZOLE 20 MG PO CPDR: DELAYED_RELEASE_CAPSULE | ORAL | 3 refills | Status: DC

## 2018-06-15 NOTE — Telephone Encounter (Signed)
Received fax from [facility] requesting rx for 90 day supply of Omeprazole 20mg  po daily be sent to [facility].

## 2018-06-15 NOTE — Telephone Encounter (Signed)
Done

## 2018-07-20 DIAGNOSIS — Z6825 Body mass index (BMI) 25.0-25.9, adult: Secondary | ICD-10-CM | POA: Diagnosis not present

## 2018-07-20 DIAGNOSIS — J343 Hypertrophy of nasal turbinates: Secondary | ICD-10-CM | POA: Diagnosis not present

## 2018-07-20 DIAGNOSIS — E663 Overweight: Secondary | ICD-10-CM | POA: Diagnosis not present

## 2018-07-20 DIAGNOSIS — J069 Acute upper respiratory infection, unspecified: Secondary | ICD-10-CM | POA: Diagnosis not present

## 2018-07-21 DIAGNOSIS — H401132 Primary open-angle glaucoma, bilateral, moderate stage: Secondary | ICD-10-CM | POA: Diagnosis not present

## 2019-01-12 ENCOUNTER — Telehealth: Payer: Self-pay | Admitting: *Deleted

## 2019-01-12 MED ORDER — OMEPRAZOLE 20 MG PO CPDR: DELAYED_RELEASE_CAPSULE | ORAL | 3 refills | Status: DC

## 2019-01-12 NOTE — Addendum Note (Signed)
Addended by: Annitta Needs on: 01/12/2019 11:38 AM   Modules accepted: Orders

## 2019-01-12 NOTE — Telephone Encounter (Signed)
Done

## 2019-01-12 NOTE — Telephone Encounter (Signed)
Received refill request from [facility] for omep 20mg  requesting Rx for 90 day supply

## 2019-01-24 ENCOUNTER — Other Ambulatory Visit (HOSPITAL_COMMUNITY): Payer: Self-pay | Admitting: Family Medicine

## 2019-01-24 ENCOUNTER — Ambulatory Visit (HOSPITAL_COMMUNITY)
Admission: RE | Admit: 2019-01-24 | Discharge: 2019-01-24 | Disposition: A | Discharge status: Home / Self Care | Payer: Medicare Other | Source: Ambulatory Visit | Attending: Family Medicine | Admitting: Family Medicine

## 2019-01-24 ENCOUNTER — Other Ambulatory Visit: Payer: Self-pay

## 2019-01-24 DIAGNOSIS — E663 Overweight: Secondary | ICD-10-CM | POA: Diagnosis not present

## 2019-01-24 DIAGNOSIS — M25511 Pain in right shoulder: Secondary | ICD-10-CM

## 2019-01-24 DIAGNOSIS — Z1389 Encounter for screening for other disorder: Secondary | ICD-10-CM | POA: Diagnosis not present

## 2019-01-24 DIAGNOSIS — E7849 Other hyperlipidemia: Secondary | ICD-10-CM | POA: Diagnosis not present

## 2019-01-24 DIAGNOSIS — Z6825 Body mass index (BMI) 25.0-25.9, adult: Secondary | ICD-10-CM | POA: Diagnosis not present

## 2019-02-07 DIAGNOSIS — H401130 Primary open-angle glaucoma, bilateral, stage unspecified: Secondary | ICD-10-CM | POA: Diagnosis not present

## 2019-02-08 ENCOUNTER — Encounter: Payer: Self-pay | Admitting: Gastroenterology

## 2019-03-07 ENCOUNTER — Other Ambulatory Visit: Payer: Self-pay | Admitting: Gastroenterology

## 2019-03-31 DIAGNOSIS — Z23 Encounter for immunization: Secondary | ICD-10-CM | POA: Diagnosis not present

## 2019-04-07 DIAGNOSIS — Z1389 Encounter for screening for other disorder: Secondary | ICD-10-CM | POA: Diagnosis not present

## 2019-04-07 DIAGNOSIS — E7849 Other hyperlipidemia: Secondary | ICD-10-CM | POA: Diagnosis not present

## 2019-04-07 DIAGNOSIS — M25511 Pain in right shoulder: Secondary | ICD-10-CM | POA: Diagnosis not present

## 2019-04-07 DIAGNOSIS — S40011A Contusion of right shoulder, initial encounter: Secondary | ICD-10-CM | POA: Diagnosis not present

## 2019-04-22 DIAGNOSIS — Z0000 Encounter for general adult medical examination without abnormal findings: Secondary | ICD-10-CM | POA: Diagnosis not present

## 2019-04-22 DIAGNOSIS — E7849 Other hyperlipidemia: Secondary | ICD-10-CM | POA: Diagnosis not present

## 2019-04-22 DIAGNOSIS — F70 Mild intellectual disabilities: Secondary | ICD-10-CM | POA: Diagnosis not present

## 2019-04-22 DIAGNOSIS — E039 Hypothyroidism, unspecified: Secondary | ICD-10-CM | POA: Diagnosis not present

## 2019-04-22 DIAGNOSIS — Z6825 Body mass index (BMI) 25.0-25.9, adult: Secondary | ICD-10-CM | POA: Diagnosis not present

## 2019-04-22 DIAGNOSIS — Z1389 Encounter for screening for other disorder: Secondary | ICD-10-CM | POA: Diagnosis not present

## 2019-05-25 ENCOUNTER — Other Ambulatory Visit: Payer: Self-pay | Admitting: Nurse Practitioner

## 2019-06-17 ENCOUNTER — Other Ambulatory Visit: Payer: Self-pay | Admitting: Nurse Practitioner

## 2019-07-21 ENCOUNTER — Telehealth: Payer: Self-pay

## 2019-07-21 ENCOUNTER — Other Ambulatory Visit: Payer: Self-pay | Admitting: Gastroenterology

## 2019-07-21 MED ORDER — OMEPRAZOLE 20 MG PO CPDR: DELAYED_RELEASE_CAPSULE | ORAL | 3 refills | Status: DC

## 2019-07-21 NOTE — Telephone Encounter (Signed)
Refill request received from [facility] for Omeprazole 20 mg #90 day supple, one tab daily.

## 2019-07-21 NOTE — Telephone Encounter (Signed)
Noted  

## 2019-07-21 NOTE — Telephone Encounter (Signed)
Rx sent 

## 2019-08-10 DIAGNOSIS — H40011 Open angle with borderline findings, low risk, right eye: Secondary | ICD-10-CM | POA: Diagnosis not present

## 2019-08-21 ENCOUNTER — Other Ambulatory Visit: Payer: Self-pay

## 2019-08-21 ENCOUNTER — Ambulatory Visit: Payer: Medicare Other | Attending: Internal Medicine

## 2019-08-21 DIAGNOSIS — Z23 Encounter for immunization: Secondary | ICD-10-CM | POA: Insufficient documentation

## 2019-08-21 NOTE — Progress Notes (Signed)
   Covid-19 Vaccination Clinic  Name:  Christopher Gates    MRN: [id number] DOB: June 19, 1951  08/21/2019  [name] was observed post Covid-19 immunization for 15 minutes without incidence. He was provided with Vaccine Information Sheet and instruction to access the V-Safe system.   [name] was instructed to call 911 with any severe reactions post vaccine: Marland Kitchen Difficulty breathing  . Swelling of your face and throat  . A fast heartbeat  . A bad rash all over your body  . Dizziness and weakness    Immunizations Administered    Name Date Dose VIS Date Route   Moderna COVID-19 Vaccine 08/21/2019  1:03 PM 0.5 mL 06/14/2019 Intramuscular   Manufacturer: Moderna   Lot: [id number]   [id number]

## 2019-09-21 ENCOUNTER — Ambulatory Visit: Payer: Medicare Other | Attending: Internal Medicine

## 2019-09-21 ENCOUNTER — Other Ambulatory Visit: Payer: Self-pay

## 2019-09-21 DIAGNOSIS — Z23 Encounter for immunization: Secondary | ICD-10-CM | POA: Insufficient documentation

## 2019-09-21 NOTE — Progress Notes (Signed)
.     Covid-19 Vaccination Clinic  Name:  Aung Moley Arntz    MRN: [id number] DOB: 1950/12/19  09/21/2019  Mr. Capuano was observed post Covid-19 immunization for 15 minutes without incident. He was provided with Vaccine Information Sheet and instruction to access the V-Safe system.   Mr. Derick was instructed to call 911 with any severe reactions post vaccine: Marland Kitchen Difficulty breathing  . Swelling of face and throat  . A fast heartbeat  . A bad rash all over body  . Dizziness and weakness   Immunizations Administered    Name Date Dose VIS Date Route   Moderna COVID-19 Vaccine 09/21/2019  1:10 PM 0.5 mL 06/14/2019 Intramuscular   Manufacturer: Moderna   Lot: [id number]   [id number]

## 2019-11-11 DIAGNOSIS — I2510 Atherosclerotic heart disease of native coronary artery without angina pectoris: Secondary | ICD-10-CM | POA: Diagnosis not present

## 2019-11-11 DIAGNOSIS — K219 Gastro-esophageal reflux disease without esophagitis: Secondary | ICD-10-CM | POA: Diagnosis not present

## 2019-11-11 DIAGNOSIS — E7849 Other hyperlipidemia: Secondary | ICD-10-CM | POA: Diagnosis not present

## 2020-01-09 DIAGNOSIS — H401132 Primary open-angle glaucoma, bilateral, moderate stage: Secondary | ICD-10-CM | POA: Diagnosis not present

## 2020-01-11 DIAGNOSIS — K219 Gastro-esophageal reflux disease without esophagitis: Secondary | ICD-10-CM | POA: Diagnosis not present

## 2020-01-11 DIAGNOSIS — E7849 Other hyperlipidemia: Secondary | ICD-10-CM | POA: Diagnosis not present

## 2020-01-11 DIAGNOSIS — I2510 Atherosclerotic heart disease of native coronary artery without angina pectoris: Secondary | ICD-10-CM | POA: Diagnosis not present

## 2020-01-17 ENCOUNTER — Other Ambulatory Visit: Payer: Self-pay | Admitting: Gastroenterology

## 2020-01-18 ENCOUNTER — Telehealth: Payer: Self-pay | Admitting: Emergency Medicine

## 2020-01-18 NOTE — Telephone Encounter (Signed)
Pt is requesting a 90 day supply of omeprazole 20mg  be sent into [facility]

## 2020-01-19 MED ORDER — OMEPRAZOLE 20 MG PO CPDR: DELAYED_RELEASE_CAPSULE | ORAL | 3 refills | Status: DC

## 2020-01-19 NOTE — Addendum Note (Signed)
Addended by: Mahala Menghini on: 01/19/2020 11:58 AM   Modules accepted: Orders

## 2020-03-07 ENCOUNTER — Other Ambulatory Visit: Payer: Self-pay

## 2020-03-07 ENCOUNTER — Encounter: Payer: Self-pay | Admitting: Gastroenterology

## 2020-03-07 ENCOUNTER — Ambulatory Visit (INDEPENDENT_AMBULATORY_CARE_PROVIDER_SITE_OTHER): Payer: Medicare Other | Admitting: Gastroenterology

## 2020-03-07 DIAGNOSIS — R195 Other fecal abnormalities: Secondary | ICD-10-CM | POA: Insufficient documentation

## 2020-03-07 NOTE — Progress Notes (Signed)
Primary Care Physician: Sharilyn Sites, MD  Primary Gastroenterologist:  Elon Alas. Abbey Chatters, DO   Chief Complaint  Patient presents with  . loose stool    HPI: Christopher Gates is a 69 y.o. male here for follow-up.  Patient last seen in August 2019.  History of gastritis but no H. pylori on endoscopy in 2018.  Also had benign-appearing esophageal stenosis.  On colonoscopy at the same time, he had extensive looping of the left colon, TI could not be intubated, remaining colon unremarkable, internal and external hemorrhoids noted.  Next colonoscopy in 2028.  Patient taking a full pack of Metamucil daily, containing approximately 3-1/2 g of fiber.  Some days will have 2-3 soft to loose stools.  May not take Metamucil the following day and stools will improve.  He does have some solid stools.  He is eating fiber cereal, whole wheat bread.  Appetite is good.  Denies any abdominal pain.  No heartburn.  No vomiting.  No blood in stool or melena.    Current Outpatient Medications  Medication Sig Dispense Refill  . aspirin EC 325 MG tablet Take 325 mg daily by mouth. (0700)    . latanoprost (XALATAN) 0.005 % ophthalmic solution Place 1 drop into both eyes at bedtime.    Marland Kitchen METAMUCIL MULTIHEALTH FIBER 58.12 % PACK MIX CONTENTS OF 1 PACKET INTO 8oz OF WATER AND DRINK ONCE DAILY. 30 each 3  . Omega-3 Fatty Acids (FISH OIL) 1000 MG CAPS Take 1,000 mg daily by mouth. (0700)    . omeprazole (PRILOSEC) 20 MG capsule 1 po 30 mins prior to first meal 90 capsule 3  . simvastatin (ZOCOR) 40 MG tablet Take 40 mg at bedtime by mouth. (2000)    . carboxymethylcellulose (REFRESH TEARS) 0.5 % SOLN Place 1 drop 3 (three) times daily as needed into both eyes (for dry/irritated eyes). (Patient not taking: Reported on 03/07/2020)     No current facility-administered medications for this visit.    Allergies as of 03/07/2020  . (No Known Allergies)    ROS:  General: Negative for anorexia, weight loss,  fever, chills, fatigue, weakness. ENT: Negative for hoarseness, difficulty swallowing , nasal congestion. CV: Negative for chest pain, angina, palpitations, dyspnea on exertion, peripheral edema.  Respiratory: Negative for dyspnea at rest, dyspnea on exertion, cough, sputum, wheezing.  GI: See history of present illness. GU:  Negative for dysuria, hematuria, urinary incontinence, urinary frequency, nocturnal urination.  Endo: Negative for unusual weight change.    Physical Examination:   BP (!) 86/51   Pulse 69   Temp (!) 96.9 F (36.1 C) (Temporal)   Ht 5\' 7"  (1.702 m)   Wt 160 lb 3.2 oz (72.7 kg)   BMI 25.09 kg/m   General: Well-nourished, well-developed in no acute distress.  Eyes: No icterus. Mouth: masked Lungs: Clear to auscultation bilaterally.  Heart: Regular rate and rhythm, no murmurs rubs or gallops.  Abdomen: Bowel sounds are normal, nontender, nondistended, no hepatosplenomegaly or masses, no abdominal bruits or hernia , no rebound or guarding.   Extremities: No lower extremity edema. No clubbing or deformities. Neuro: Alert and oriented x 4   Skin: Warm and dry, no jaundice.   Psych: Alert and cooperative, normal mood and affect.    Imaging Studies: No results found.   Impression/plan:  Pleasant 69 year old gentleman presenting from a group home for further evaluation of loose stools.  Patient has a history of constipation.  Managed with Metamucil daily.  Currently taking a whole pack daily, approximately 3-1/2 g of fiber.  Also eating additional fiber in the diet.  Group home staff concerned that he has several trips to the bathroom for a bowel movement every day.  Patient reports that stools have been looser.  Denies any blood in the stool or melena.  No other alarm symptoms.  If he skips a day of Metamucil, his bowel movements improved significantly.  His last colonoscopy was in 2018 as outlined above.   Initially will switch his Metamucil to a half a packet  daily.  If he continues to have frequent stools, can consider discontinuing.  As long as bowel movements improve, no further work-up needed at this time.  Group home staff has been instructed to let us know if they have ongoing concerns with this bowel function.  Otherwise we will see him back in 1 year for follow-up.

## 2020-03-07 NOTE — Patient Instructions (Signed)
1. Decrease Metamucil to a half a packet daily.  If you continue to have frequent stools or loose stools, please contact the office for further instructions. 2. Return to the office in 1 year or sooner if needed.

## 2020-03-12 ENCOUNTER — Telehealth: Payer: Self-pay | Admitting: *Deleted

## 2020-03-12 NOTE — Telephone Encounter (Signed)
Ms. Philipp Ovens (Administrator from [facility]) called to inform us that she needs [place] to send a RX to [facility] to change Metamucil directions and administration.  She said that pt was directed to take 1/2 packet of Metamucil daily by Neil Crouch, PA.  She said that the RX needs to state that or how many teaspoons that are equivalent to 1/2 packet.

## 2020-03-13 DIAGNOSIS — K219 Gastro-esophageal reflux disease without esophagitis: Secondary | ICD-10-CM | POA: Diagnosis not present

## 2020-03-13 DIAGNOSIS — I2510 Atherosclerotic heart disease of native coronary artery without angina pectoris: Secondary | ICD-10-CM | POA: Diagnosis not present

## 2020-03-13 DIAGNOSIS — E7849 Other hyperlipidemia: Secondary | ICD-10-CM | POA: Diagnosis not present

## 2020-03-13 MED ORDER — METAMUCIL MULTIHEALTH FIBER 58.12 % PO PACK: PACK | ORAL | 3 refills | Status: DC

## 2020-03-13 NOTE — Addendum Note (Signed)
Addended by: Mahala Menghini on: 03/13/2020 07:48 AM   Modules accepted: Orders

## 2020-04-11 DIAGNOSIS — Z6823 Body mass index (BMI) 23.0-23.9, adult: Secondary | ICD-10-CM | POA: Diagnosis not present

## 2020-04-11 DIAGNOSIS — E039 Hypothyroidism, unspecified: Secondary | ICD-10-CM | POA: Diagnosis not present

## 2020-04-11 DIAGNOSIS — F70 Mild intellectual disabilities: Secondary | ICD-10-CM | POA: Diagnosis not present

## 2020-04-11 DIAGNOSIS — E7849 Other hyperlipidemia: Secondary | ICD-10-CM | POA: Diagnosis not present

## 2020-04-11 DIAGNOSIS — K219 Gastro-esophageal reflux disease without esophagitis: Secondary | ICD-10-CM | POA: Diagnosis not present

## 2020-04-11 DIAGNOSIS — Z23 Encounter for immunization: Secondary | ICD-10-CM | POA: Diagnosis not present

## 2020-04-11 DIAGNOSIS — I82402 Acute embolism and thrombosis of unspecified deep veins of left lower extremity: Secondary | ICD-10-CM | POA: Diagnosis not present

## 2020-04-26 DIAGNOSIS — Z1331 Encounter for screening for depression: Secondary | ICD-10-CM | POA: Diagnosis not present

## 2020-04-26 DIAGNOSIS — E039 Hypothyroidism, unspecified: Secondary | ICD-10-CM | POA: Diagnosis not present

## 2020-04-26 DIAGNOSIS — E7849 Other hyperlipidemia: Secondary | ICD-10-CM | POA: Diagnosis not present

## 2020-04-26 DIAGNOSIS — Z6824 Body mass index (BMI) 24.0-24.9, adult: Secondary | ICD-10-CM | POA: Diagnosis not present

## 2020-04-26 DIAGNOSIS — Z0001 Encounter for general adult medical examination with abnormal findings: Secondary | ICD-10-CM | POA: Diagnosis not present

## 2020-04-26 DIAGNOSIS — I82402 Acute embolism and thrombosis of unspecified deep veins of left lower extremity: Secondary | ICD-10-CM | POA: Diagnosis not present

## 2020-05-12 DIAGNOSIS — K219 Gastro-esophageal reflux disease without esophagitis: Secondary | ICD-10-CM | POA: Diagnosis not present

## 2020-05-12 DIAGNOSIS — E7849 Other hyperlipidemia: Secondary | ICD-10-CM | POA: Diagnosis not present

## 2020-05-25 DIAGNOSIS — Z23 Encounter for immunization: Secondary | ICD-10-CM | POA: Diagnosis not present

## 2020-07-10 DIAGNOSIS — H401132 Primary open-angle glaucoma, bilateral, moderate stage: Secondary | ICD-10-CM | POA: Diagnosis not present

## 2020-09-24 ENCOUNTER — Other Ambulatory Visit: Payer: Self-pay | Admitting: Gastroenterology

## 2020-10-10 DIAGNOSIS — E7849 Other hyperlipidemia: Secondary | ICD-10-CM | POA: Diagnosis not present

## 2020-10-10 DIAGNOSIS — K219 Gastro-esophageal reflux disease without esophagitis: Secondary | ICD-10-CM | POA: Diagnosis not present

## 2020-12-11 DIAGNOSIS — E7849 Other hyperlipidemia: Secondary | ICD-10-CM | POA: Diagnosis not present

## 2020-12-11 DIAGNOSIS — K219 Gastro-esophageal reflux disease without esophagitis: Secondary | ICD-10-CM | POA: Diagnosis not present

## 2021-01-03 DIAGNOSIS — H401132 Primary open-angle glaucoma, bilateral, moderate stage: Secondary | ICD-10-CM | POA: Diagnosis not present

## 2021-02-07 ENCOUNTER — Encounter: Payer: Self-pay | Admitting: Internal Medicine

## 2021-02-15 DIAGNOSIS — Z23 Encounter for immunization: Secondary | ICD-10-CM | POA: Diagnosis not present

## 2021-03-11 DIAGNOSIS — K219 Gastro-esophageal reflux disease without esophagitis: Secondary | ICD-10-CM | POA: Diagnosis not present

## 2021-03-11 DIAGNOSIS — Z1389 Encounter for screening for other disorder: Secondary | ICD-10-CM | POA: Diagnosis not present

## 2021-03-11 DIAGNOSIS — E782 Mixed hyperlipidemia: Secondary | ICD-10-CM | POA: Diagnosis not present

## 2021-03-11 DIAGNOSIS — Z6823 Body mass index (BMI) 23.0-23.9, adult: Secondary | ICD-10-CM | POA: Diagnosis not present

## 2021-03-11 DIAGNOSIS — E7849 Other hyperlipidemia: Secondary | ICD-10-CM | POA: Diagnosis not present

## 2021-03-11 DIAGNOSIS — H401130 Primary open-angle glaucoma, bilateral, stage unspecified: Secondary | ICD-10-CM | POA: Diagnosis not present

## 2021-03-11 DIAGNOSIS — Z1331 Encounter for screening for depression: Secondary | ICD-10-CM | POA: Diagnosis not present

## 2021-03-11 DIAGNOSIS — F70 Mild intellectual disabilities: Secondary | ICD-10-CM | POA: Diagnosis not present

## 2021-03-11 DIAGNOSIS — E039 Hypothyroidism, unspecified: Secondary | ICD-10-CM | POA: Diagnosis not present

## 2021-03-19 ENCOUNTER — Telehealth: Payer: Self-pay

## 2021-03-19 MED ORDER — OMEPRAZOLE 20 MG PO CPDR: DELAYED_RELEASE_CAPSULE | ORAL | 3 refills | Status: DC

## 2021-03-19 NOTE — Telephone Encounter (Signed)
Completed.

## 2021-03-19 NOTE — Telephone Encounter (Signed)
Received fax from [facility] requesting rx for Omeprazole '20mg'$  90 day supply be sent to [place].

## 2021-04-17 DIAGNOSIS — Z23 Encounter for immunization: Secondary | ICD-10-CM | POA: Diagnosis not present

## 2021-05-17 DIAGNOSIS — Z23 Encounter for immunization: Secondary | ICD-10-CM | POA: Diagnosis not present

## 2021-06-18 DIAGNOSIS — H04123 Dry eye syndrome of bilateral lacrimal glands: Secondary | ICD-10-CM | POA: Diagnosis not present

## 2021-07-18 DIAGNOSIS — Z125 Encounter for screening for malignant neoplasm of prostate: Secondary | ICD-10-CM | POA: Diagnosis not present

## 2021-07-18 DIAGNOSIS — K219 Gastro-esophageal reflux disease without esophagitis: Secondary | ICD-10-CM | POA: Diagnosis not present

## 2021-07-18 DIAGNOSIS — F70 Mild intellectual disabilities: Secondary | ICD-10-CM | POA: Diagnosis not present

## 2021-07-18 DIAGNOSIS — Z6824 Body mass index (BMI) 24.0-24.9, adult: Secondary | ICD-10-CM | POA: Diagnosis not present

## 2021-07-18 DIAGNOSIS — E7849 Other hyperlipidemia: Secondary | ICD-10-CM | POA: Diagnosis not present

## 2021-07-18 DIAGNOSIS — Z1331 Encounter for screening for depression: Secondary | ICD-10-CM | POA: Diagnosis not present

## 2021-07-18 DIAGNOSIS — E782 Mixed hyperlipidemia: Secondary | ICD-10-CM | POA: Diagnosis not present

## 2021-07-18 DIAGNOSIS — E039 Hypothyroidism, unspecified: Secondary | ICD-10-CM | POA: Diagnosis not present

## 2021-07-18 DIAGNOSIS — Z0000 Encounter for general adult medical examination without abnormal findings: Secondary | ICD-10-CM | POA: Diagnosis not present

## 2021-07-18 DIAGNOSIS — E663 Overweight: Secondary | ICD-10-CM | POA: Diagnosis not present

## 2021-09-26 NOTE — Progress Notes (Signed)
? ? ?Referring Provider: Sharilyn Sites, MD ?Primary Care Physician:  Sharilyn Sites, MD ?Primary GI Physician: Dr. Abbey Chatters ? ?Chief Complaint  ?Patient presents with  ? Follow-up  ? ? ?HPI:   ?Christopher Gates is a 71 y.o. male with history of constipation, GERD, gastritis on endoscopy in 2018 without H. pylori.  Colonoscopy also completed in 2018 with extensive looping of the left colon, TI could not be intubated, remaining colon unremarkable, internal/external hemorrhoids with recommendations to repeat colonoscopy in 2028.  He is presenting today for follow-up.  ? ?Last seen in our office 03/07/2020 for loose stools.  He was taking an entire pack of Metamucil daily for history of constipation.  He was also eating additional fiber in his diet.  Patient reported that stools have been looser but denied any rectal bleeding or other alarm symptoms..  If skipping a day of Metamucil, bowel movements improved significantly.  Recommended decreasing Metamucil to 1/2 packet daily and if he continued to have frequent stools, discontinue altogether.  As long as bowel movements improved, no additional work-up needed.  Recommended follow-up in 1 year or sooner if needed. ? ?Today: ?Doing very well today.  No GI concerns.  GERD is well controlled on omeprazole 20 mg daily.  Denies abdominal pain, nausea, vomiting, dysphagia.  No longer having loose stools and no constipation.  Bowels are moving daily, soft and formed, and without BRBPR or melena.  No longer taking Metamucil. ? ?Past Medical History:  ?Diagnosis Date  ? GERD (gastroesophageal reflux disease)   ? Hyperlipidemia   ? Shoulder fracture, left   ? ? ?Past Surgical History:  ?Procedure Laterality Date  ? BIOPSY  06/01/2017  ? Procedure: BIOPSY;  Surgeon: Danie Binder, MD;  Location: [facility];  Service: Endoscopy;;  gastric ?  ? COLONOSCOPY N/A 06/01/2017  ? Dr. Oneida Alar: extensive looping of the left colon, cannot intubate the terminal ileum, internal and external  hemorrhoids. Next colonoscopy in 10 years.  ? ESOPHAGOGASTRODUODENOSCOPY N/A 06/01/2017  ? Dr. Oneida Alar: gastritis with no H. pylori. benign appearing esophageal stenosis, fundic gland polyp removed.   ? facial cysts?    ? ? ?Current Outpatient Medications  ?Medication Sig Dispense Refill  ? aspirin EC 325 MG tablet Take 325 mg daily by mouth. (0700)    ? latanoprost (XALATAN) 0.005 % ophthalmic solution Place 1 drop into both eyes at bedtime.    ? Omega-3 Fatty Acids (FISH OIL) 1000 MG CAPS Take 1,000 mg daily by mouth. (0700)    ? omeprazole (PRILOSEC) 20 MG capsule TAKE 1 CAPSULE BY MOUTH ONCE DAILY 30 MINUTES PRIOR TO 1st MEAL. 90 capsule 3  ? simvastatin (ZOCOR) 40 MG tablet Take 40 mg at bedtime by mouth. (2000)    ? ?No current facility-administered medications for this visit.  ? ? ?Allergies as of 09/27/2021  ? (No Known Allergies)  ? ? ?Family History  ?Problem Relation Age of Onset  ? Colon cancer Neg Hx   ? Colon polyps Neg Hx   ? ? ?Social History  ? ?Socioeconomic History  ? Marital status: Single  ?  Spouse name: Not on file  ? Number of children: Not on file  ? Years of education: Not on file  ? Highest education level: Not on file  ?Occupational History  ? Not on file  ?Tobacco Use  ? Smoking status: Never  ? Smokeless tobacco: Never  ?Vaping Use  ? Vaping Use: Never used  ?Substance and Sexual Activity  ?  Alcohol use: No  ? Drug use: No  ? Sexual activity: Not on file  ?Other Topics Concern  ? Not on file  ?Social History Narrative  ? Not on file  ? ?Social Determinants of Health  ? ?Financial Resource Strain: Not on file  ?Food Insecurity: Not on file  ?Transportation Needs: Not on file  ?Physical Activity: Not on file  ?Stress: Not on file  ?Social Connections: Not on file  ? ? ?Review of Systems: ?Gen: Denies fever, chills, cold or flulike symptoms, presyncope, syncope. ?CV: Denies chest pain, palpitations. ?Resp: Denies dyspnea or cough. ?GI: See HPI ?Heme: See HPI ? ?Physical Exam: ?BP 108/64    Pulse 80   Temp (!) 97.5 ?F (36.4 ?C) (Temporal)   Ht '5\' 9"'$  (1.753 m)   Wt 159 lb (72.1 kg)   BMI 23.48 kg/m?  ?General:   Alert and oriented. No distress noted. Pleasant and cooperative.  ?Head:  Normocephalic and atraumatic. ?Eyes:  Conjuctiva clear without scleral icterus. ?Heart:  S1, S2 present without murmurs appreciated. ?Lungs:  Clear to auscultation bilaterally. No wheezes, rales, or rhonchi. No distress.  ?Abdomen:  +BS, soft, non-tender and non-distended. No rebound or guarding. No HSM or masses noted. ?Msk:  Symmetrical without gross deformities. Normal posture. ?Extremities:  Without edema. ?Neurologic:  Alert and  oriented x4 ?Psych:  Normal mood and affect.  ? ? ?Assessment: ?71 year old male with history of constipation, GERD, gastritis on endoscopy in 2018 without H. pylori.  Colonoscopy also completed in 2018 with extensive looping of the left colon, TI could not be intubated, remaining colon unremarkable, internal/external hemorrhoids with recommendations to repeat colonoscopy in 2028, presenting today for routine follow-up.  ? ?GERD:  ?Well-controlled on omeprazole 20 mg daily.  No alarm symptoms. ? ?Constipation: ?Resolved.  No longer requiring Metamucil.  No alarm symptoms. ? ? ?Plan: ?Continue omeprazole 20 mg daily 30 minutes before breakfast. ?Follow-up in 1 year or sooner if needed. ? ? ?Aliene Altes, PA-C ?[facility] ?09/27/2021 ? ?

## 2021-09-27 ENCOUNTER — Other Ambulatory Visit: Payer: Self-pay

## 2021-09-27 ENCOUNTER — Encounter: Payer: Self-pay | Admitting: Gastroenterology

## 2021-09-27 ENCOUNTER — Ambulatory Visit (INDEPENDENT_AMBULATORY_CARE_PROVIDER_SITE_OTHER): Payer: Medicare Other | Admitting: Gastroenterology

## 2021-09-27 VITALS — BP 108/64 | HR 80 | Temp 97.5°F | Ht 69.0 in | Wt 159.0 lb

## 2021-09-27 DIAGNOSIS — K219 Gastro-esophageal reflux disease without esophagitis: Secondary | ICD-10-CM

## 2021-09-27 NOTE — Patient Instructions (Signed)
Continue omeprazole 20 mg daily 30 minutes before breakfast. ? ?We will plan to see you back in 1 year.  Do not hesitate to call with any questions or concerns prior to your next visit. ? ?It was very nice meeting you today!  I am glad you are doing well! ? ?Aliene Altes, PA-C ?[facility] ? ?

## 2021-10-15 DIAGNOSIS — L819 Disorder of pigmentation, unspecified: Secondary | ICD-10-CM | POA: Diagnosis not present

## 2021-10-15 DIAGNOSIS — L603 Nail dystrophy: Secondary | ICD-10-CM | POA: Diagnosis not present

## 2021-11-27 DIAGNOSIS — D2272 Melanocytic nevi of left lower limb, including hip: Secondary | ICD-10-CM | POA: Diagnosis not present

## 2021-12-17 DIAGNOSIS — H401132 Primary open-angle glaucoma, bilateral, moderate stage: Secondary | ICD-10-CM | POA: Diagnosis not present

## 2021-12-30 DIAGNOSIS — D2272 Melanocytic nevi of left lower limb, including hip: Secondary | ICD-10-CM | POA: Diagnosis not present

## 2022-02-25 ENCOUNTER — Other Ambulatory Visit: Payer: Self-pay | Admitting: Gastroenterology

## 2022-03-06 DIAGNOSIS — F70 Mild intellectual disabilities: Secondary | ICD-10-CM | POA: Diagnosis not present

## 2022-03-06 DIAGNOSIS — E782 Mixed hyperlipidemia: Secondary | ICD-10-CM | POA: Diagnosis not present

## 2022-03-06 DIAGNOSIS — E039 Hypothyroidism, unspecified: Secondary | ICD-10-CM | POA: Diagnosis not present

## 2022-03-06 DIAGNOSIS — H401130 Primary open-angle glaucoma, bilateral, stage unspecified: Secondary | ICD-10-CM | POA: Diagnosis not present

## 2022-03-06 DIAGNOSIS — Z86718 Personal history of other venous thrombosis and embolism: Secondary | ICD-10-CM | POA: Diagnosis not present

## 2022-03-06 DIAGNOSIS — Z6823 Body mass index (BMI) 23.0-23.9, adult: Secondary | ICD-10-CM | POA: Diagnosis not present

## 2022-03-06 DIAGNOSIS — K219 Gastro-esophageal reflux disease without esophagitis: Secondary | ICD-10-CM | POA: Diagnosis not present

## 2022-05-08 DIAGNOSIS — Z23 Encounter for immunization: Secondary | ICD-10-CM | POA: Diagnosis not present

## 2022-06-18 DIAGNOSIS — H401131 Primary open-angle glaucoma, bilateral, mild stage: Secondary | ICD-10-CM | POA: Diagnosis not present

## 2022-09-02 ENCOUNTER — Encounter: Payer: Self-pay | Admitting: Internal Medicine

## 2022-09-25 DIAGNOSIS — Z125 Encounter for screening for malignant neoplasm of prostate: Secondary | ICD-10-CM | POA: Diagnosis not present

## 2022-09-25 DIAGNOSIS — Z1331 Encounter for screening for depression: Secondary | ICD-10-CM | POA: Diagnosis not present

## 2022-09-25 DIAGNOSIS — Z6824 Body mass index (BMI) 24.0-24.9, adult: Secondary | ICD-10-CM | POA: Diagnosis not present

## 2022-09-25 DIAGNOSIS — H401130 Primary open-angle glaucoma, bilateral, stage unspecified: Secondary | ICD-10-CM | POA: Diagnosis not present

## 2022-09-25 DIAGNOSIS — E782 Mixed hyperlipidemia: Secondary | ICD-10-CM | POA: Diagnosis not present

## 2022-09-25 DIAGNOSIS — Z86718 Personal history of other venous thrombosis and embolism: Secondary | ICD-10-CM | POA: Diagnosis not present

## 2022-09-25 DIAGNOSIS — E7849 Other hyperlipidemia: Secondary | ICD-10-CM | POA: Diagnosis not present

## 2022-09-25 DIAGNOSIS — E039 Hypothyroidism, unspecified: Secondary | ICD-10-CM | POA: Diagnosis not present

## 2022-09-25 DIAGNOSIS — F70 Mild intellectual disabilities: Secondary | ICD-10-CM | POA: Diagnosis not present

## 2022-09-25 DIAGNOSIS — Z0001 Encounter for general adult medical examination with abnormal findings: Secondary | ICD-10-CM | POA: Diagnosis not present

## 2022-09-25 DIAGNOSIS — K219 Gastro-esophageal reflux disease without esophagitis: Secondary | ICD-10-CM | POA: Diagnosis not present

## 2023-01-01 DIAGNOSIS — E782 Mixed hyperlipidemia: Secondary | ICD-10-CM | POA: Diagnosis not present

## 2023-01-01 DIAGNOSIS — F70 Mild intellectual disabilities: Secondary | ICD-10-CM | POA: Diagnosis not present

## 2023-01-01 DIAGNOSIS — E039 Hypothyroidism, unspecified: Secondary | ICD-10-CM | POA: Diagnosis not present

## 2023-01-01 DIAGNOSIS — Z6825 Body mass index (BMI) 25.0-25.9, adult: Secondary | ICD-10-CM | POA: Diagnosis not present

## 2023-01-01 DIAGNOSIS — E663 Overweight: Secondary | ICD-10-CM | POA: Diagnosis not present

## 2023-02-02 DIAGNOSIS — E782 Mixed hyperlipidemia: Secondary | ICD-10-CM | POA: Diagnosis not present

## 2023-02-02 DIAGNOSIS — U071 COVID-19: Secondary | ICD-10-CM | POA: Diagnosis not present

## 2023-02-02 DIAGNOSIS — Z20828 Contact with and (suspected) exposure to other viral communicable diseases: Secondary | ICD-10-CM | POA: Diagnosis not present

## 2023-02-02 DIAGNOSIS — Z6824 Body mass index (BMI) 24.0-24.9, adult: Secondary | ICD-10-CM | POA: Diagnosis not present

## 2023-04-27 DIAGNOSIS — Z23 Encounter for immunization: Secondary | ICD-10-CM | POA: Diagnosis not present

## 2023-04-28 DIAGNOSIS — Z23 Encounter for immunization: Secondary | ICD-10-CM | POA: Diagnosis not present

## 2023-06-09 DIAGNOSIS — H401131 Primary open-angle glaucoma, bilateral, mild stage: Secondary | ICD-10-CM | POA: Diagnosis not present

## 2023-11-17 DIAGNOSIS — Z0001 Encounter for general adult medical examination with abnormal findings: Secondary | ICD-10-CM | POA: Diagnosis not present

## 2023-11-17 DIAGNOSIS — F70 Mild intellectual disabilities: Secondary | ICD-10-CM | POA: Diagnosis not present

## 2023-11-17 DIAGNOSIS — E039 Hypothyroidism, unspecified: Secondary | ICD-10-CM | POA: Diagnosis not present

## 2023-11-17 DIAGNOSIS — H401130 Primary open-angle glaucoma, bilateral, stage unspecified: Secondary | ICD-10-CM | POA: Diagnosis not present

## 2023-11-17 DIAGNOSIS — E7849 Other hyperlipidemia: Secondary | ICD-10-CM | POA: Diagnosis not present

## 2023-11-17 DIAGNOSIS — Z125 Encounter for screening for malignant neoplasm of prostate: Secondary | ICD-10-CM | POA: Diagnosis not present

## 2023-11-17 DIAGNOSIS — Z86718 Personal history of other venous thrombosis and embolism: Secondary | ICD-10-CM | POA: Diagnosis not present

## 2023-11-17 DIAGNOSIS — K219 Gastro-esophageal reflux disease without esophagitis: Secondary | ICD-10-CM | POA: Diagnosis not present

## 2023-11-17 DIAGNOSIS — E782 Mixed hyperlipidemia: Secondary | ICD-10-CM | POA: Diagnosis not present

## 2023-11-17 DIAGNOSIS — Z1331 Encounter for screening for depression: Secondary | ICD-10-CM | POA: Diagnosis not present

## 2023-11-17 DIAGNOSIS — Z6824 Body mass index (BMI) 24.0-24.9, adult: Secondary | ICD-10-CM | POA: Diagnosis not present

## 2023-11-17 DIAGNOSIS — Z20828 Contact with and (suspected) exposure to other viral communicable diseases: Secondary | ICD-10-CM | POA: Diagnosis not present

## 2023-12-10 DIAGNOSIS — H401132 Primary open-angle glaucoma, bilateral, moderate stage: Secondary | ICD-10-CM | POA: Diagnosis not present

## 2024-03-24 ENCOUNTER — Ambulatory Visit: Admitting: Nurse Practitioner

## 2024-04-12 ENCOUNTER — Ambulatory Visit: Admitting: Nurse Practitioner

## 2024-05-18 DIAGNOSIS — H401190 Primary open-angle glaucoma, unspecified eye, stage unspecified: Secondary | ICD-10-CM | POA: Diagnosis not present

## 2024-05-18 DIAGNOSIS — K219 Gastro-esophageal reflux disease without esophagitis: Secondary | ICD-10-CM | POA: Diagnosis not present

## 2024-05-18 DIAGNOSIS — Z7689 Persons encountering health services in other specified circumstances: Secondary | ICD-10-CM | POA: Diagnosis not present

## 2024-05-18 DIAGNOSIS — Z23 Encounter for immunization: Secondary | ICD-10-CM | POA: Diagnosis not present

## 2024-05-18 DIAGNOSIS — E782 Mixed hyperlipidemia: Secondary | ICD-10-CM | POA: Diagnosis not present

## 2024-05-18 DIAGNOSIS — F5104 Psychophysiologic insomnia: Secondary | ICD-10-CM | POA: Diagnosis not present

## 2024-06-13 DIAGNOSIS — E782 Mixed hyperlipidemia: Secondary | ICD-10-CM | POA: Diagnosis not present

## 2024-06-13 DIAGNOSIS — Z125 Encounter for screening for malignant neoplasm of prostate: Secondary | ICD-10-CM | POA: Diagnosis not present

## 2024-06-13 DIAGNOSIS — Z6824 Body mass index (BMI) 24.0-24.9, adult: Secondary | ICD-10-CM | POA: Diagnosis not present

## 2024-06-13 DIAGNOSIS — E039 Hypothyroidism, unspecified: Secondary | ICD-10-CM | POA: Diagnosis not present

## 2024-06-16 DIAGNOSIS — F5104 Psychophysiologic insomnia: Secondary | ICD-10-CM | POA: Diagnosis not present

## 2024-06-16 DIAGNOSIS — K219 Gastro-esophageal reflux disease without esophagitis: Secondary | ICD-10-CM | POA: Diagnosis not present

## 2024-06-16 DIAGNOSIS — E782 Mixed hyperlipidemia: Secondary | ICD-10-CM | POA: Diagnosis not present

## 2024-06-16 DIAGNOSIS — E039 Hypothyroidism, unspecified: Secondary | ICD-10-CM | POA: Diagnosis not present
